# Patient Record
Sex: Female | Born: 1985
Health system: Southern US, Community
[De-identification: ages and names within clinical notes are randomized; demographics above are authoritative.]

## PROBLEM LIST (undated history)

## (undated) ENCOUNTER — Ambulatory Visit

## (undated) DIAGNOSIS — F32A Depression, unspecified: Secondary | ICD-10-CM

## (undated) DIAGNOSIS — E559 Vitamin D deficiency, unspecified: Secondary | ICD-10-CM

## (undated) DIAGNOSIS — F419 Anxiety disorder, unspecified: Secondary | ICD-10-CM

## (undated) DIAGNOSIS — Z789 Other specified health status: Secondary | ICD-10-CM

## (undated) DIAGNOSIS — F988 Other specified behavioral and emotional disorders with onset usually occurring in childhood and adolescence: Secondary | ICD-10-CM

## (undated) HISTORY — DX: Depression, unspecified: F32.A

## (undated) HISTORY — DX: Anxiety disorder, unspecified: F41.9

## (undated) HISTORY — DX: Other specified behavioral and emotional disorders with onset usually occurring in childhood and adolescence: F98.8

---
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
## 2010-02-03 ENCOUNTER — Emergency Department (HOSPITAL_COMMUNITY)
Admission: EM | Admit: 2010-02-03 | Discharge: 2010-02-04 | Payer: Self-pay | Source: Home / Self Care | Admitting: Emergency Medicine

## 2010-07-27 ENCOUNTER — Other Ambulatory Visit: Payer: Self-pay | Admitting: Obstetrics and Gynecology

## 2010-07-27 ENCOUNTER — Other Ambulatory Visit (HOSPITAL_COMMUNITY)
Admission: RE | Admit: 2010-07-27 | Discharge: 2010-07-27 | Disposition: A | Discharge status: Home / Self Care | Payer: BC Managed Care – PPO | Source: Ambulatory Visit | Attending: Obstetrics and Gynecology | Admitting: Obstetrics and Gynecology

## 2010-07-27 DIAGNOSIS — Z1159 Encounter for screening for other viral diseases: Secondary | ICD-10-CM | POA: Insufficient documentation

## 2010-07-27 DIAGNOSIS — Z01419 Encounter for gynecological examination (general) (routine) without abnormal findings: Secondary | ICD-10-CM | POA: Insufficient documentation

## 2010-07-27 DIAGNOSIS — Z113 Encounter for screening for infections with a predominantly sexual mode of transmission: Secondary | ICD-10-CM | POA: Insufficient documentation

## 2010-08-18 LAB — DIFFERENTIAL
Basophils Absolute: 0.1 10*3/uL (ref 0.0–0.1)
Eosinophils Relative: 3 % (ref 0–5)
Lymphocytes Relative: 40 % (ref 12–46)
Lymphs Abs: 4.7 10*3/uL — ABNORMAL HIGH (ref 0.7–4.0)
Monocytes Absolute: 0.6 10*3/uL (ref 0.1–1.0)
Monocytes Relative: 5 % (ref 3–12)
Neutro Abs: 6.1 10*3/uL (ref 1.7–7.7)

## 2010-08-18 LAB — URINALYSIS, ROUTINE W REFLEX MICROSCOPIC
Bilirubin Urine: NEGATIVE
Glucose, UA: NEGATIVE mg/dL
Ketones, ur: NEGATIVE mg/dL
Specific Gravity, Urine: 1.009 (ref 1.005–1.030)
pH: 7.5 (ref 5.0–8.0)

## 2010-08-18 LAB — CBC
HCT: 39.4 % (ref 36.0–46.0)
Hemoglobin: 13.5 g/dL (ref 12.0–15.0)
MCV: 87.9 fL (ref 78.0–100.0)
RDW: 12.8 % (ref 11.5–15.5)
WBC: 11.8 10*3/uL — ABNORMAL HIGH (ref 4.0–10.5)

## 2010-08-18 LAB — BASIC METABOLIC PANEL
BUN: 11 mg/dL (ref 6–23)
Chloride: 102 mEq/L (ref 96–112)
Potassium: 3.9 mEq/L (ref 3.5–5.1)
Sodium: 137 mEq/L (ref 135–145)

## 2010-09-22 ENCOUNTER — Emergency Department (HOSPITAL_COMMUNITY)
Admission: EM | Admit: 2010-09-22 | Discharge: 2010-09-23 | Disposition: A | Discharge status: Home / Self Care | Payer: BC Managed Care – PPO | Attending: Emergency Medicine | Admitting: Emergency Medicine

## 2010-09-22 DIAGNOSIS — R45851 Suicidal ideations: Secondary | ICD-10-CM | POA: Insufficient documentation

## 2010-09-22 DIAGNOSIS — Y929 Unspecified place or not applicable: Secondary | ICD-10-CM | POA: Insufficient documentation

## 2010-09-22 DIAGNOSIS — T43205A Adverse effect of unspecified antidepressants, initial encounter: Secondary | ICD-10-CM | POA: Insufficient documentation

## 2010-09-22 DIAGNOSIS — F319 Bipolar disorder, unspecified: Secondary | ICD-10-CM | POA: Insufficient documentation

## 2010-09-22 DIAGNOSIS — F411 Generalized anxiety disorder: Secondary | ICD-10-CM | POA: Insufficient documentation

## 2010-09-22 DIAGNOSIS — F1994 Other psychoactive substance use, unspecified with psychoactive substance-induced mood disorder: Secondary | ICD-10-CM | POA: Insufficient documentation

## 2010-09-22 DIAGNOSIS — Z79899 Other long term (current) drug therapy: Secondary | ICD-10-CM | POA: Insufficient documentation

## 2010-09-22 LAB — POCT I-STAT, CHEM 8
BUN: 5 mg/dL — ABNORMAL LOW (ref 6–23)
Calcium, Ion: 1.14 mmol/L (ref 1.12–1.32)
Chloride: 101 mEq/L (ref 96–112)
Creatinine, Ser: 0.7 mg/dL (ref 0.4–1.2)
Glucose, Bld: 97 mg/dL (ref 70–99)
Potassium: 3.6 mEq/L (ref 3.5–5.1)

## 2010-09-22 LAB — CBC
HCT: 36.4 % (ref 36.0–46.0)
Hemoglobin: 12.5 g/dL (ref 12.0–15.0)
MCHC: 34.3 g/dL (ref 30.0–36.0)
MCV: 82.9 fL (ref 78.0–100.0)
RDW: 13 % (ref 11.5–15.5)

## 2010-09-22 LAB — DIFFERENTIAL
Basophils Absolute: 0.1 10*3/uL (ref 0.0–0.1)
Eosinophils Relative: 3 % (ref 0–5)
Lymphocytes Relative: 34 % (ref 12–46)
Lymphs Abs: 4.7 10*3/uL — ABNORMAL HIGH (ref 0.7–4.0)
Monocytes Absolute: 0.8 10*3/uL (ref 0.1–1.0)
Neutro Abs: 7.8 10*3/uL — ABNORMAL HIGH (ref 1.7–7.7)

## 2010-09-22 LAB — ETHANOL: Alcohol, Ethyl (B): <5 mg/dL (ref 0–10)

## 2010-09-22 LAB — RAPID URINE DRUG SCREEN, HOSP PERFORMED
Amphetamines: NOT DETECTED
Cocaine: NOT DETECTED
Opiates: NOT DETECTED
Tetrahydrocannabinol: NOT DETECTED

## 2012-03-18 IMAGING — CR DG CHEST 2V
2 series · 2 of 2 positions shown · non-contrast
Comparison: None.

CLINICAL DATA: Shortness of breath with chest discomfort for
several days.

CHEST - 2 VIEW

[w chest pa]
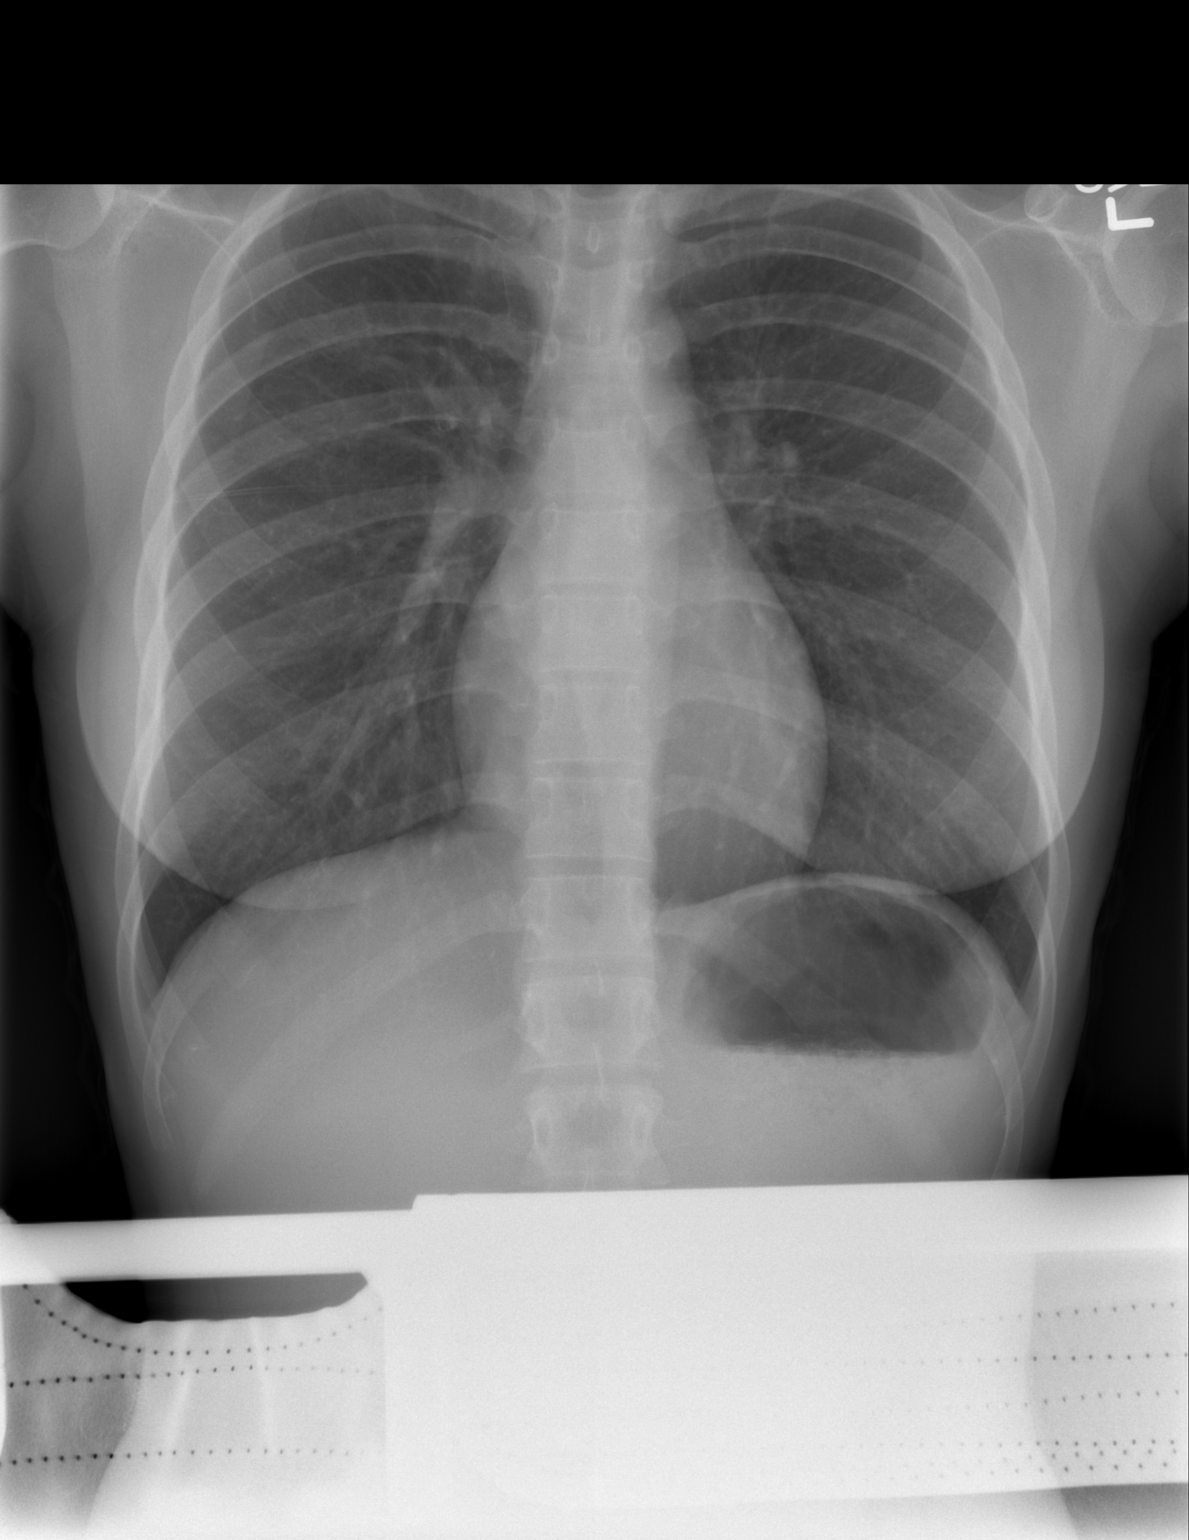

[w chest lat]
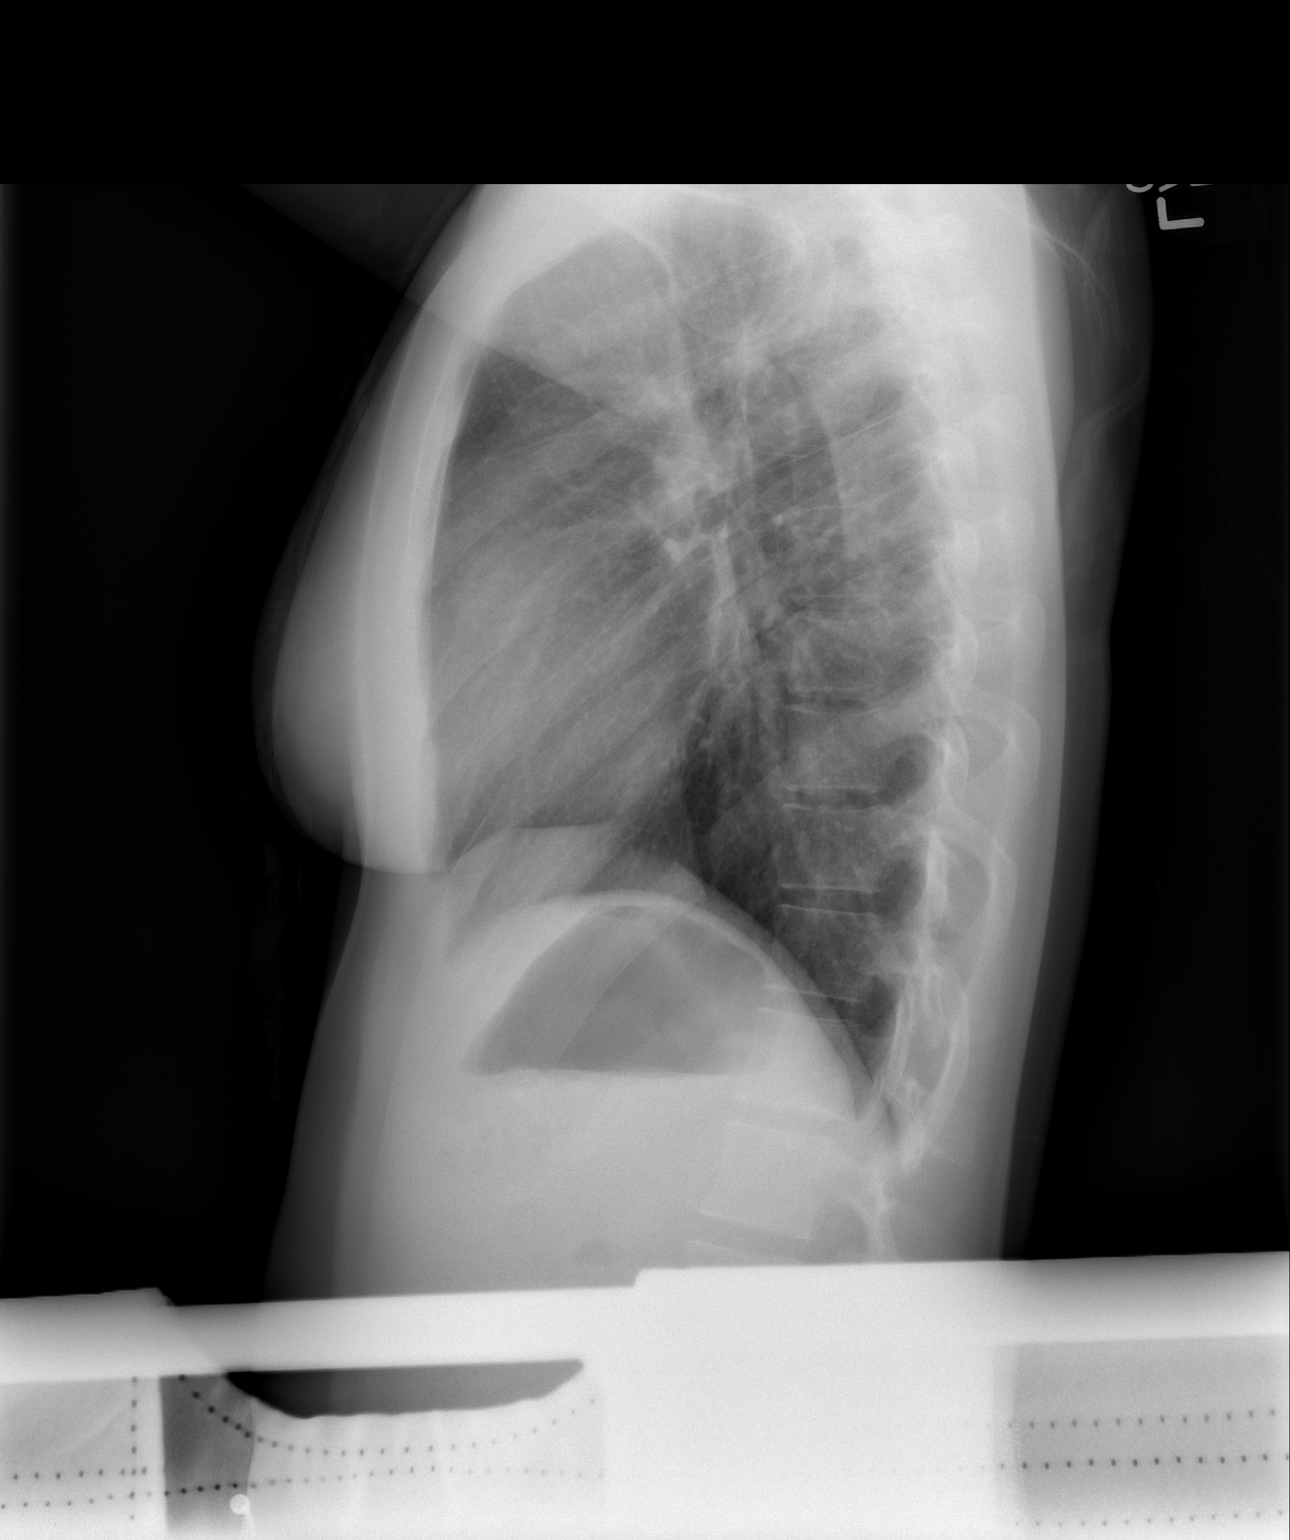

[2 of 2 positions shown; findings below may reference images not displayed]

FINDINGS: The heart size and mediastinal contours are normal. The
lungs are clear. There is no pleural effusion or pneumothorax. No
acute osseous findings are identified.
IMPRESSION: No active cardiopulmonary process.

## 2013-01-29 ENCOUNTER — Encounter (HOSPITAL_COMMUNITY): Payer: Self-pay | Admitting: Anesthesiology

## 2013-01-29 ENCOUNTER — Inpatient Hospital Stay (HOSPITAL_COMMUNITY): Payer: BC Managed Care – PPO | Admitting: Anesthesiology

## 2013-01-29 ENCOUNTER — Encounter (HOSPITAL_COMMUNITY): Payer: Self-pay | Admitting: *Deleted

## 2013-01-29 ENCOUNTER — Inpatient Hospital Stay (HOSPITAL_COMMUNITY)
Admission: AD | Admit: 2013-01-29 | Discharge: 2013-02-01 | DRG: 370 | Disposition: A | Discharge status: Home / Self Care | Payer: BC Managed Care – PPO | Source: Ambulatory Visit | Attending: Obstetrics and Gynecology | Admitting: Obstetrics and Gynecology

## 2013-01-29 DIAGNOSIS — D62 Acute posthemorrhagic anemia: Secondary | ICD-10-CM | POA: Diagnosis not present

## 2013-01-29 DIAGNOSIS — O4290 Premature rupture of membranes, unspecified as to length of time between rupture and onset of labor, unspecified weeks of gestation: Secondary | ICD-10-CM | POA: Diagnosis present

## 2013-01-29 DIAGNOSIS — O9903 Anemia complicating the puerperium: Secondary | ICD-10-CM | POA: Diagnosis not present

## 2013-01-29 HISTORY — DX: Other specified health status: Z78.9

## 2013-01-29 LAB — OB RESULTS CONSOLE RUBELLA ANTIBODY, IGM: Rubella: IMMUNE

## 2013-01-29 LAB — CBC
HCT: 38.2 % (ref 36.0–46.0)
Hemoglobin: 13.1 g/dL (ref 12.0–15.0)
MCH: 29.7 pg (ref 26.0–34.0)
MCHC: 34.3 g/dL (ref 30.0–36.0)
MCV: 86.6 fL (ref 78.0–100.0)
Platelets: 198 10*3/uL (ref 150–400)
RBC: 4.41 MIL/uL (ref 3.87–5.11)
RDW: 13.7 % (ref 11.5–15.5)
WBC: 17.2 10*3/uL — ABNORMAL HIGH (ref 4.0–10.5)

## 2013-01-29 LAB — TYPE AND SCREEN: Antibody Screen: NEGATIVE

## 2013-01-29 LAB — OB RESULTS CONSOLE GC/CHLAMYDIA
Chlamydia: NEGATIVE
Gonorrhea: NEGATIVE

## 2013-01-29 LAB — OB RESULTS CONSOLE ABO/RH

## 2013-01-29 LAB — OB RESULTS CONSOLE HEPATITIS B SURFACE ANTIGEN: Hepatitis B Surface Ag: NEGATIVE

## 2013-01-29 LAB — OB RESULTS CONSOLE ANTIBODY SCREEN: Antibody Screen: NEGATIVE

## 2013-01-29 LAB — RPR: RPR Ser Ql: NONREACTIVE

## 2013-01-29 MED ORDER — DIPHENHYDRAMINE HCL 50 MG/ML IJ SOLN: 12.5000 mg | INTRAMUSCULAR | Status: DC | PRN

## 2013-01-29 MED ORDER — LIDOCAINE HCL (PF) 1 % IJ SOLN
INTRAMUSCULAR | Status: DC | PRN
  Administered 2013-01-29 (×3): 5 mL

## 2013-01-29 MED ORDER — OXYTOCIN 40 UNITS IN LACTATED RINGERS INFUSION - SIMPLE MED: 62.5000 mL/h | INTRAVENOUS | Status: DC

## 2013-01-29 MED ORDER — IBUPROFEN 600 MG PO TABS: 600.0000 mg | ORAL_TABLET | Freq: Four times a day (QID) | ORAL | Status: DC | PRN

## 2013-01-29 MED ORDER — OXYTOCIN 40 UNITS IN LACTATED RINGERS INFUSION - SIMPLE MED
1.0000 m[IU]/min | INTRAVENOUS | Status: DC
  Administered 2013-01-29: 2 m[IU]/min via INTRAVENOUS
  Filled 2013-01-29: qty 1000

## 2013-01-29 MED ORDER — LACTATED RINGERS IV SOLN
500.0000 mL | INTRAVENOUS | Status: DC | PRN
  Administered 2013-01-29: 1000 mL via INTRAVENOUS

## 2013-01-29 MED ORDER — FENTANYL 2.5 MCG/ML BUPIVACAINE 1/10 % EPIDURAL INFUSION (WH - ANES)
INTRAMUSCULAR | Status: DC | PRN
  Administered 2013-01-29: 14 mL/h via EPIDURAL

## 2013-01-29 MED ORDER — EPHEDRINE 5 MG/ML INJ: 10.0000 mg | INTRAVENOUS | Status: DC | PRN

## 2013-01-29 MED ORDER — EPHEDRINE 5 MG/ML INJ
10.0000 mg | INTRAVENOUS | Status: DC | PRN
  Filled 2013-01-29: qty 4

## 2013-01-29 MED ORDER — BUTORPHANOL TARTRATE 1 MG/ML IJ SOLN
1.0000 mg | Freq: Once | INTRAMUSCULAR | Status: AC
  Administered 2013-01-29: 1 mg via INTRAVENOUS
  Filled 2013-01-29: qty 1

## 2013-01-29 MED ORDER — OXYTOCIN BOLUS FROM INFUSION: 500.0000 mL | INTRAVENOUS | Status: DC

## 2013-01-29 MED ORDER — PHENYLEPHRINE 40 MCG/ML (10ML) SYRINGE FOR IV PUSH (FOR BLOOD PRESSURE SUPPORT)
80.0000 ug | PREFILLED_SYRINGE | INTRAVENOUS | Status: DC | PRN
  Filled 2013-01-29: qty 5

## 2013-01-29 MED ORDER — FENTANYL 2.5 MCG/ML BUPIVACAINE 1/10 % EPIDURAL INFUSION (WH - ANES)
14.0000 mL/h | INTRAMUSCULAR | Status: DC | PRN
  Filled 2013-01-29: qty 125

## 2013-01-29 MED ORDER — LACTATED RINGERS IV SOLN
500.0000 mL | Freq: Once | INTRAVENOUS | Status: AC
  Administered 2013-01-29: 500 mL via INTRAVENOUS

## 2013-01-29 MED ORDER — LIDOCAINE HCL (PF) 1 % IJ SOLN: 30.0000 mL | INTRAMUSCULAR | Status: DC | PRN

## 2013-01-29 MED ORDER — PHENYLEPHRINE 40 MCG/ML (10ML) SYRINGE FOR IV PUSH (FOR BLOOD PRESSURE SUPPORT): 80.0000 ug | PREFILLED_SYRINGE | INTRAVENOUS | Status: DC | PRN

## 2013-01-29 MED ORDER — ACETAMINOPHEN 325 MG PO TABS: 650.0000 mg | ORAL_TABLET | ORAL | Status: DC | PRN

## 2013-01-29 MED ORDER — CITRIC ACID-SODIUM CITRATE 334-500 MG/5ML PO SOLN
30.0000 mL | ORAL | Status: DC | PRN
  Administered 2013-01-30: 30 mL via ORAL
  Filled 2013-01-29: qty 15

## 2013-01-29 NOTE — Progress Notes (Signed)
S:  Painful ctx - questioning about pain medicine versus epidural  O:  VS: Blood pressure 117/81, pulse 73, temperature 98.1 F (36.7 C), temperature source Oral, resp. rate 20, height 5' (1.524 m), weight 70.761 kg (156 lb).        FHR : baseline 145 / variability moderate / accelerations + / early decelerations        Toco: contractions every 2-3 minutes / moderate / pitocin 35mu/min        Cervix : 5cm / 90% / vtx / -1        Membranes: clear  A: active labor     FHR category 1  P: discussed pain medication versus epidural anesthesia      desires to try IV medication initially   Marlinda Mike CNM, MSN, [facility] 01/29/2013, 8:03 PM

## 2013-01-29 NOTE — Progress Notes (Signed)
Beacon applied 

## 2013-01-29 NOTE — Anesthesia Procedure Notes (Signed)
Epidural Patient location during procedure: OB  Staffing Anesthesiologist: Jep Dyas Performed by: anesthesiologist   Preanesthetic Checklist Completed: patient identified, site marked, surgical consent, pre-op evaluation, timeout performed, IV checked, risks and benefits discussed and monitors and equipment checked  Epidural Patient position: sitting Prep: ChloraPrep Patient monitoring: heart rate, continuous pulse ox and blood pressure Approach: midline Injection technique: LOR saline  Needle:  Needle type: Tuohy  Needle gauge: 17 G Needle length: 9 cm and 9 Needle insertion depth: 6 cm Catheter type: closed end flexible Catheter size: 20 Guage Catheter at skin depth: 12 cm Test dose: negative  Assessment Events: blood not aspirated, injection not painful, no injection resistance, negative IV test and no paresthesia  Additional Notes   Patient tolerated the insertion well without complications.   

## 2013-01-29 NOTE — Progress Notes (Signed)
S:  painful ctx - moaning and crying with ctx       did not like how IV medication made her feel - does not want any more IV medication       really does not want epidural         O:  VS: Blood pressure 124/88, pulse 88, temperature 98.1 F (36.7 C), temperature source Oral, resp. rate 20, height 5' (1.524 m), weight 70.761 kg (156 lb).        FHR : baseline 140 / variability moderate / accelerations + / no decelerations        Toco: contractions every 3-5 minutes / moderate / pitocin 4 mu/min        Cervix : 6cm / 90% / vtx / -1 / LOT        Membranes: clear fluid with pink show  A: active labor     FHR category 1  P: expectant management      labor support with midwife and student midwife at bedside  Marlinda Mike CNM, MSN, [facility] 01/29/2013, 9:21 PM

## 2013-01-29 NOTE — H&P (Signed)
OB ADMISSION/ HISTORY & PHYSICAL:  Admission Date: 01/29/2013 10:41 AM  Admit Diagnosis: 40.2 weeks  / normal labor onset SROM  Kristin Gonzalez is a 27 y.o. female presenting for onset of labor after SROM at 0800 / seen in office SROM confirmed  By Dr Juliene Pina - cervix 2cm dilated / discussed pitocin augmentation. Desires no intervention if possible - wants to try natural childbirth. Requests local for IV start.  Prenatal History: G3P0020   EDC : 01/27/2013, by Other Basis  Prenatal care at [facility]  Primary Ob Provider: Dr Juliene Pina Prenatal course complicated by tobacco use / low AFI  Prenatal Labs: ABO, Rh: O/Positive/-- (08/27 1100) Antibody: Negative (08/27 1100) Rubella: Immune (08/27 1100)  RPR: Nonreactive (08/27 1100)  HBsAg: Negative (08/27 1100)  HIV: Non-reactive (08/27 1100)  GTT: NL GBS: Negative (08/27 1100)   Medical / Surgical History :  Past medical history:  Past Medical History  Diagnosis Date  . Medical history non-contributory      Past surgical history: History reviewed. No pertinent past surgical history.  Family History: History reviewed. No pertinent family history.   Social History:  reports that she has quit smoking. Her smoking use included Cigarettes. She smoked 0.00 packs per day. She does not have any smokeless tobacco history on file. She reports that she does not drink alcohol or use illicit drugs.  Allergies: Review of patient's allergies indicates no known allergies.   Current Medications at time of admission:  Prior to Admission medications   Not on File    Review of Systems: Active FM onset of ctx @ 0800 currently every 5 minutes LOF  / SROM @ 0800 bloody show none  Physical Exam:  VS: Height 5' (1.524 m), weight 156 lb (70.761 kg).  General: alert and oriented, appears comfortable without pain Heart: RRR Lungs: Clear lung fields Abdomen: Gravid, soft and non-tender, non-distended / uterus: gravid Extremities: no  edema  Genitalia / VE:  deferred  FHR: baseline rate 135 / variability moderate / accelerations + / no decelerations TOCO: mild irregular ctx  Assessment: 40.[redacted]  weeks gestation latent stage of labor - possible PROM FHR category 1   Plan:  Admit Expectant management - re-evaluate at 1400 for pitocin augmentation Dr Cherly Hensen notified of admission - CNM to follow after 1pm for labor / birth due to patient request for female provider   Marlinda Mike CNM, MSN, [facility] 01/29/2013, 11:28 AM

## 2013-01-29 NOTE — Progress Notes (Signed)
S:  crying with ctx - "cant take it anymore"      wants epidural  O:  VS: Blood pressure 132/70, pulse 72, temperature 99.3 F (37.4 C), temperature source Oral, resp. rate 20, height 5' (1.524 m), weight 70.761 kg (156 lb).        FHR : baseline 140 / variability moderate / accelerations + / occasional mild variable decelerations        Toco: contractions every 2-3 minutes / moderate -strong / Pitocin 27mu/min        Cervix : same        Membranes: pink discharge  A: active labor     FHR category 1  P: epidural placement   Marlinda Mike CNM, MSN, [facility] 01/29/2013, 10:24 PM

## 2013-01-29 NOTE — Anesthesia Preprocedure Evaluation (Signed)

## 2013-01-29 NOTE — Progress Notes (Signed)
S:  Contractions more uncomfortable - breathing with ctx now       Would like to continue to ambulate at this time  O:  VS: Blood pressure 125/68, pulse 71, temperature 98.1 F (36.7 C), temperature source Oral, resp. rate 18, height 5' (1.524 m), weight 70.761 kg (156 lb).        FHR : baseline 135 / variability moderate / accelerations + / no decelerations        Toco: contractions every 2-4 minutes / 30-60 / Mild        Cervix : 3.5 / 80% / vtx -1        Membranes: clear fluid  A: latent labor     FHR category 1  P: Discussed pitocin augmentation versus continued expectant management      Patient desires expectant management at this time        Recheck in 1-3 hours   Marlinda Mike CNM, MSN, [facility] 01/29/2013, 2:30 PM

## 2013-01-29 NOTE — Progress Notes (Signed)
S:  No change in ctx - not more painful  O:  VS: Blood pressure 121/75, pulse 70, temperature 97.7 F (36.5 C), temperature source Oral, resp. rate 20, height 5' (1.524 m), weight 70.761 kg (156 lb).        FHR : baseline 135 / variability moderate / accelerations + / no decelerations        Toco: contractions every 3-5 minutes / mild         Cervix : 3.5 / 80 % / vtx -1        Membranes: clear  IV start with 1 % xylocaine local - # 18 gauge to right hand after initial site in right forearm with edema at site  A: PROM - latent labor     FHR category 1  P: pitocin augmentation     Midwifery labor support    Marlinda Mike CNM, MSN, [facility] 01/29/2013, 5:14 PM

## 2013-01-30 ENCOUNTER — Encounter (HOSPITAL_COMMUNITY): Payer: Self-pay | Admitting: Anesthesiology

## 2013-01-30 ENCOUNTER — Encounter (HOSPITAL_COMMUNITY)
Admission: AD | Disposition: A | Discharge status: Home / Self Care | Payer: Self-pay | Source: Ambulatory Visit | Attending: Obstetrics and Gynecology

## 2013-01-30 SURGERY — Surgical Case
Anesthesia: Regional

## 2013-01-30 SURGERY — Surgical Case
Anesthesia: Epidural | Site: Abdomen | Wound class: Clean Contaminated

## 2013-01-30 MED ORDER — TERBUTALINE SULFATE 1 MG/ML IJ SOLN
0.2500 mg | Freq: Once | INTRAMUSCULAR | Status: AC
  Administered 2013-01-30: 0.25 mg via SUBCUTANEOUS

## 2013-01-30 MED ORDER — SENNOSIDES-DOCUSATE SODIUM 8.6-50 MG PO TABS
2.0000 | ORAL_TABLET | Freq: Every day | ORAL | Status: DC
  Administered 2013-01-30 – 2013-01-31 (×2): 2 via ORAL

## 2013-01-30 MED ORDER — FENTANYL CITRATE 0.05 MG/ML IJ SOLN: 25.0000 ug | INTRAMUSCULAR | Status: DC | PRN

## 2013-01-30 MED ORDER — ONDANSETRON HCL 4 MG/2ML IJ SOLN: 4.0000 mg | INTRAMUSCULAR | Status: DC | PRN

## 2013-01-30 MED ORDER — ONDANSETRON HCL 4 MG/2ML IJ SOLN
INTRAMUSCULAR | Status: AC
  Filled 2013-01-30: qty 2

## 2013-01-30 MED ORDER — DIPHENHYDRAMINE HCL 50 MG/ML IJ SOLN: 25.0000 mg | INTRAMUSCULAR | Status: DC | PRN

## 2013-01-30 MED ORDER — LACTATED RINGERS IV SOLN
INTRAVENOUS | Status: DC | PRN
  Administered 2013-01-30 (×3): via INTRAVENOUS

## 2013-01-30 MED ORDER — LACTATED RINGERS IV SOLN
INTRAVENOUS | Status: DC
  Administered 2013-01-30: 11:00:00 via INTRAVENOUS

## 2013-01-30 MED ORDER — SODIUM CHLORIDE 0.9 % IJ SOLN: 3.0000 mL | INTRAMUSCULAR | Status: DC | PRN

## 2013-01-30 MED ORDER — LACTATED RINGERS IV SOLN: INTRAVENOUS | Status: DC

## 2013-01-30 MED ORDER — ONDANSETRON HCL 4 MG/2ML IJ SOLN: 4.0000 mg | Freq: Three times a day (TID) | INTRAMUSCULAR | Status: DC | PRN

## 2013-01-30 MED ORDER — ZOLPIDEM TARTRATE 5 MG PO TABS: 5.0000 mg | ORAL_TABLET | Freq: Every evening | ORAL | Status: DC | PRN

## 2013-01-30 MED ORDER — OXYCODONE-ACETAMINOPHEN 5-325 MG PO TABS
1.0000 | ORAL_TABLET | ORAL | Status: DC | PRN
  Administered 2013-01-31 – 2013-02-01 (×3): 1 via ORAL
  Filled 2013-01-30 (×3): qty 1

## 2013-01-30 MED ORDER — NALBUPHINE HCL 10 MG/ML IJ SOLN
5.0000 mg | INTRAMUSCULAR | Status: DC | PRN
  Filled 2013-01-30: qty 1

## 2013-01-30 MED ORDER — MORPHINE SULFATE 0.5 MG/ML IJ SOLN
INTRAMUSCULAR | Status: AC
  Filled 2013-01-30: qty 10

## 2013-01-30 MED ORDER — NALOXONE HCL 1 MG/ML IJ SOLN
1.0000 ug/kg/h | INTRAMUSCULAR | Status: DC | PRN
  Filled 2013-01-30: qty 2

## 2013-01-30 MED ORDER — CEFAZOLIN SODIUM-DEXTROSE 2-3 GM-% IV SOLR
INTRAVENOUS | Status: DC | PRN
  Administered 2013-01-30: 2 g via INTRAVENOUS

## 2013-01-30 MED ORDER — SCOPOLAMINE 1 MG/3DAYS TD PT72
1.0000 | MEDICATED_PATCH | Freq: Once | TRANSDERMAL | Status: DC
  Administered 2013-01-30: 1.5 mg via TRANSDERMAL

## 2013-01-30 MED ORDER — KETOROLAC TROMETHAMINE 60 MG/2ML IM SOLN
INTRAMUSCULAR | Status: AC
  Filled 2013-01-30: qty 2

## 2013-01-30 MED ORDER — LIDOCAINE-EPINEPHRINE (PF) 2 %-1:200000 IJ SOLN
INTRAMUSCULAR | Status: AC
  Filled 2013-01-30: qty 20

## 2013-01-30 MED ORDER — TETANUS-DIPHTH-ACELL PERTUSSIS 5-2.5-18.5 LF-MCG/0.5 IM SUSP
0.5000 mL | Freq: Once | INTRAMUSCULAR | Status: DC
  Filled 2013-01-30: qty 0.5

## 2013-01-30 MED ORDER — MORPHINE SULFATE (PF) 0.5 MG/ML IJ SOLN
INTRAMUSCULAR | Status: DC | PRN
  Administered 2013-01-30: 4 mg via EPIDURAL

## 2013-01-30 MED ORDER — METOCLOPRAMIDE HCL 5 MG/ML IJ SOLN
INTRAMUSCULAR | Status: AC
  Filled 2013-01-30: qty 2

## 2013-01-30 MED ORDER — DIPHENHYDRAMINE HCL 25 MG PO CAPS: 25.0000 mg | ORAL_CAPSULE | ORAL | Status: DC | PRN

## 2013-01-30 MED ORDER — ONDANSETRON HCL 4 MG/2ML IJ SOLN
INTRAMUSCULAR | Status: DC | PRN
  Administered 2013-01-30: 4 mg via INTRAVENOUS

## 2013-01-30 MED ORDER — SIMETHICONE 80 MG PO CHEW
80.0000 mg | CHEWABLE_TABLET | Freq: Three times a day (TID) | ORAL | Status: DC
  Administered 2013-01-30 – 2013-02-01 (×6): 80 mg via ORAL

## 2013-01-30 MED ORDER — KETOROLAC TROMETHAMINE 30 MG/ML IJ SOLN: 30.0000 mg | Freq: Four times a day (QID) | INTRAMUSCULAR | Status: AC | PRN

## 2013-01-30 MED ORDER — FENTANYL CITRATE 0.05 MG/ML IJ SOLN
INTRAMUSCULAR | Status: AC
  Filled 2013-01-30: qty 2

## 2013-01-30 MED ORDER — FENTANYL CITRATE 0.05 MG/ML IJ SOLN
INTRAMUSCULAR | Status: DC | PRN
  Administered 2013-01-30 (×2): 50 ug via INTRAVENOUS

## 2013-01-30 MED ORDER — MORPHINE SULFATE (PF) 0.5 MG/ML IJ SOLN
INTRAMUSCULAR | Status: DC | PRN
  Administered 2013-01-30: 1 mg via INTRAVENOUS

## 2013-01-30 MED ORDER — CEFAZOLIN SODIUM-DEXTROSE 2-3 GM-% IV SOLR
INTRAVENOUS | Status: AC
  Filled 2013-01-30: qty 50

## 2013-01-30 MED ORDER — NALOXONE HCL 0.4 MG/ML IJ SOLN: 0.4000 mg | INTRAMUSCULAR | Status: DC | PRN

## 2013-01-30 MED ORDER — MEPERIDINE HCL 25 MG/ML IJ SOLN: 6.2500 mg | INTRAMUSCULAR | Status: DC | PRN

## 2013-01-30 MED ORDER — METOCLOPRAMIDE HCL 5 MG/ML IJ SOLN
10.0000 mg | Freq: Three times a day (TID) | INTRAMUSCULAR | Status: DC | PRN
  Administered 2013-01-30: 10 mg via INTRAVENOUS

## 2013-01-30 MED ORDER — OXYTOCIN 40 UNITS IN LACTATED RINGERS INFUSION - SIMPLE MED: 62.5000 mL/h | INTRAVENOUS | Status: AC

## 2013-01-30 MED ORDER — LANOLIN HYDROUS EX OINT: 1.0000 "application " | TOPICAL_OINTMENT | CUTANEOUS | Status: DC | PRN

## 2013-01-30 MED ORDER — ONDANSETRON HCL 4 MG PO TABS: 4.0000 mg | ORAL_TABLET | ORAL | Status: DC | PRN

## 2013-01-30 MED ORDER — SCOPOLAMINE 1 MG/3DAYS TD PT72
MEDICATED_PATCH | TRANSDERMAL | Status: AC
  Filled 2013-01-30: qty 1

## 2013-01-30 MED ORDER — ACETAMINOPHEN 500 MG PO TABS
1000.0000 mg | ORAL_TABLET | Freq: Four times a day (QID) | ORAL | Status: DC | PRN
  Administered 2013-01-30: 1000 mg via ORAL
  Filled 2013-01-30: qty 2

## 2013-01-30 MED ORDER — MENTHOL 3 MG MT LOZG
1.0000 | LOZENGE | OROMUCOSAL | Status: DC | PRN
  Filled 2013-01-30: qty 9

## 2013-01-30 MED ORDER — PRENATAL MULTIVITAMIN CH
1.0000 | ORAL_TABLET | Freq: Every day | ORAL | Status: DC
  Filled 2013-01-30 (×4): qty 1

## 2013-01-30 MED ORDER — OXYTOCIN 10 UNIT/ML IJ SOLN
40.0000 [IU] | INTRAVENOUS | Status: DC | PRN
  Administered 2013-01-30: 40 [IU] via INTRAVENOUS

## 2013-01-30 MED ORDER — CEFAZOLIN SODIUM-DEXTROSE 2-3 GM-% IV SOLR
2.0000 g | INTRAVENOUS | Status: AC
  Filled 2013-01-30: qty 50

## 2013-01-30 MED ORDER — WITCH HAZEL-GLYCERIN EX PADS: 1.0000 "application " | MEDICATED_PAD | CUTANEOUS | Status: DC | PRN

## 2013-01-30 MED ORDER — DIPHENHYDRAMINE HCL 50 MG/ML IJ SOLN
12.5000 mg | INTRAMUSCULAR | Status: DC | PRN
  Administered 2013-01-30: 12.5 mg via INTRAVENOUS
  Filled 2013-01-30: qty 1

## 2013-01-30 MED ORDER — SODIUM BICARBONATE 8.4 % IV SOLN
INTRAVENOUS | Status: AC
  Filled 2013-01-30: qty 50

## 2013-01-30 MED ORDER — OXYTOCIN 10 UNIT/ML IJ SOLN
INTRAMUSCULAR | Status: AC
  Filled 2013-01-30: qty 4

## 2013-01-30 MED ORDER — TERBUTALINE SULFATE 1 MG/ML IJ SOLN
INTRAMUSCULAR | Status: AC
  Filled 2013-01-30: qty 1

## 2013-01-30 MED ORDER — DIPHENHYDRAMINE HCL 25 MG PO CAPS
25.0000 mg | ORAL_CAPSULE | Freq: Four times a day (QID) | ORAL | Status: DC | PRN
  Filled 2013-01-30: qty 1

## 2013-01-30 MED ORDER — SIMETHICONE 80 MG PO CHEW: 80.0000 mg | CHEWABLE_TABLET | ORAL | Status: DC | PRN

## 2013-01-30 MED ORDER — DIBUCAINE 1 % RE OINT
1.0000 "application " | TOPICAL_OINTMENT | RECTAL | Status: DC | PRN
  Filled 2013-01-30: qty 28

## 2013-01-30 MED ORDER — KETOROLAC TROMETHAMINE 60 MG/2ML IM SOLN
60.0000 mg | Freq: Once | INTRAMUSCULAR | Status: AC | PRN
  Administered 2013-01-30: 60 mg via INTRAMUSCULAR

## 2013-01-30 MED ORDER — IBUPROFEN 600 MG PO TABS
600.0000 mg | ORAL_TABLET | Freq: Four times a day (QID) | ORAL | Status: DC
  Administered 2013-01-30 – 2013-02-01 (×9): 600 mg via ORAL
  Filled 2013-01-30 (×7): qty 1

## 2013-01-30 MED ORDER — SODIUM BICARBONATE 8.4 % IV SOLN
INTRAVENOUS | Status: DC | PRN
  Administered 2013-01-30: 10 mL via EPIDURAL

## 2013-01-30 SURGICAL SUPPLY — 38 items
APL SKNCLS STERI-STRIP NONHPOA (GAUZE/BANDAGES/DRESSINGS) ×1
BENZOIN TINCTURE PRP APPL 2/3 (GAUZE/BANDAGES/DRESSINGS) ×1 IMPLANT
CLAMP CORD UMBIL (MISCELLANEOUS) IMPLANT
CLOTH BEACON ORANGE TIMEOUT ST (SAFETY) ×2 IMPLANT
CONTAINER PREFILL 10% NBF 15ML (MISCELLANEOUS) IMPLANT
DRAPE LG THREE QUARTER DISP (DRAPES) ×2 IMPLANT
DRSG OPSITE POSTOP 4X10 (GAUZE/BANDAGES/DRESSINGS) ×2 IMPLANT
DURAPREP 26ML APPLICATOR (WOUND CARE) ×2 IMPLANT
ELECT REM PT RETURN 9FT ADLT (ELECTROSURGICAL) ×2
ELECTRODE REM PT RTRN 9FT ADLT (ELECTROSURGICAL) ×1 IMPLANT
EXTRACTOR VACUUM KIWI (MISCELLANEOUS) IMPLANT
EXTRACTOR VACUUM M CUP 4 TUBE (SUCTIONS) ×1 IMPLANT
GLOVE BIO SURGEON STRL SZ7 (GLOVE) ×2 IMPLANT
GLOVE BIOGEL PI IND STRL 7.0 (GLOVE) ×1 IMPLANT
GLOVE BIOGEL PI INDICATOR 7.0 (GLOVE) ×1
GOWN STRL REIN XL XLG (GOWN DISPOSABLE) ×4 IMPLANT
KIT ABG SYR 3ML LUER SLIP (SYRINGE) IMPLANT
NDL HYPO 25X5/8 SAFETYGLIDE (NEEDLE) IMPLANT
NEEDLE HYPO 25X5/8 SAFETYGLIDE (NEEDLE) IMPLANT
NS IRRIG 1000ML POUR BTL (IV SOLUTION) ×2 IMPLANT
PACK C SECTION WH (CUSTOM PROCEDURE TRAY) ×2 IMPLANT
PAD OB MATERNITY 4.3X12.25 (PERSONAL CARE ITEMS) ×2 IMPLANT
RTRCTR C-SECT PINK 25CM LRG (MISCELLANEOUS) IMPLANT
STAPLER VISISTAT 35W (STAPLE) IMPLANT
STRIP CLOSURE SKIN 1/4X4 (GAUZE/BANDAGES/DRESSINGS) ×1 IMPLANT
SUT MON AB-0 CT1 36 (SUTURE) ×6 IMPLANT
SUT PLAIN 0 NONE (SUTURE) IMPLANT
SUT PLAIN 2 0 (SUTURE)
SUT PLAIN ABS 2-0 CT1 27XMFL (SUTURE) IMPLANT
SUT VIC AB 0 CT1 27 (SUTURE) ×4
SUT VIC AB 0 CT1 27XBRD ANBCTR (SUTURE) ×2 IMPLANT
SUT VIC AB 2-0 CT1 27 (SUTURE) ×4
SUT VIC AB 2-0 CT1 TAPERPNT 27 (SUTURE) ×2 IMPLANT
SUT VIC AB 4-0 KS 27 (SUTURE) IMPLANT
SUT VICRYL 0 TIES 12 18 (SUTURE) IMPLANT
TOWEL OR 17X24 6PK STRL BLUE (TOWEL DISPOSABLE) ×2 IMPLANT
TRAY FOLEY CATH 14FR (SET/KITS/TRAYS/PACK) IMPLANT
WATER STERILE IRR 1000ML POUR (IV SOLUTION) ×2 IMPLANT

## 2013-01-30 SURGICAL SUPPLY — 38 items
APL SKNCLS STERI-STRIP NONHPOA (GAUZE/BANDAGES/DRESSINGS)
BENZOIN TINCTURE PRP APPL 2/3 (GAUZE/BANDAGES/DRESSINGS) IMPLANT
CLAMP CORD UMBIL (MISCELLANEOUS) IMPLANT
CLOTH BEACON ORANGE TIMEOUT ST (SAFETY) ×1 IMPLANT
CONTAINER PREFILL 10% NBF 15ML (MISCELLANEOUS) IMPLANT
DRAPE LG THREE QUARTER DISP (DRAPES) ×1 IMPLANT
DRSG OPSITE POSTOP 4X10 (GAUZE/BANDAGES/DRESSINGS) ×1 IMPLANT
DURAPREP 26ML APPLICATOR (WOUND CARE) ×1 IMPLANT
ELECT REM PT RETURN 9FT ADLT (ELECTROSURGICAL)
ELECTRODE REM PT RTRN 9FT ADLT (ELECTROSURGICAL) ×1 IMPLANT
EXTRACTOR VACUUM KIWI (MISCELLANEOUS) IMPLANT
EXTRACTOR VACUUM M CUP 4 TUBE (SUCTIONS) IMPLANT
GLOVE BIO SURGEON STRL SZ7 (GLOVE) ×1 IMPLANT
GLOVE BIOGEL PI IND STRL 7.0 (GLOVE) ×1 IMPLANT
GLOVE BIOGEL PI INDICATOR 7.0 (GLOVE)
GOWN STRL REIN XL XLG (GOWN DISPOSABLE) ×2 IMPLANT
KIT ABG SYR 3ML LUER SLIP (SYRINGE) IMPLANT
NDL HYPO 25X5/8 SAFETYGLIDE (NEEDLE) IMPLANT
NEEDLE HYPO 25X5/8 SAFETYGLIDE (NEEDLE) IMPLANT
NS IRRIG 1000ML POUR BTL (IV SOLUTION) ×1 IMPLANT
PACK C SECTION WH (CUSTOM PROCEDURE TRAY) ×1 IMPLANT
PAD OB MATERNITY 4.3X12.25 (PERSONAL CARE ITEMS) ×1 IMPLANT
RTRCTR C-SECT PINK 25CM LRG (MISCELLANEOUS) IMPLANT
STAPLER VISISTAT 35W (STAPLE) IMPLANT
STRIP CLOSURE SKIN 1/4X4 (GAUZE/BANDAGES/DRESSINGS) IMPLANT
SUT MON AB-0 CT1 36 (SUTURE) ×3 IMPLANT
SUT PLAIN 0 NONE (SUTURE) IMPLANT
SUT PLAIN 2 0 (SUTURE)
SUT PLAIN ABS 2-0 CT1 27XMFL (SUTURE) IMPLANT
SUT VIC AB 0 CT1 27 (SUTURE)
SUT VIC AB 0 CT1 27XBRD ANBCTR (SUTURE) ×2 IMPLANT
SUT VIC AB 2-0 CT1 27 (SUTURE)
SUT VIC AB 2-0 CT1 TAPERPNT 27 (SUTURE) ×2 IMPLANT
SUT VIC AB 4-0 KS 27 (SUTURE) IMPLANT
SUT VICRYL 0 TIES 12 18 (SUTURE) IMPLANT
TOWEL OR 17X24 6PK STRL BLUE (TOWEL DISPOSABLE) ×1 IMPLANT
TRAY FOLEY CATH 14FR (SET/KITS/TRAYS/PACK) IMPLANT
WATER STERILE IRR 1000ML POUR (IV SOLUTION) ×1 IMPLANT

## 2013-01-30 NOTE — Progress Notes (Signed)
Patient ID: Anneth Brunell, female   DOB: 1985/10/08, 27 y.o.   MRN: [id number] INTERVAL NOTE:  S:   Sitting in bed, feeling very sleepy, min cramping, (+) voids, small bleed, denies HA/NV/dizziness  Reports itching - no relief after Benadryl given ~25 mins ago   Planning circ with [facility] within 2 weeks  O:   VSS, AAO x 3, NAD  Fundus @ U  Scant lochia  A / P:   PPD #0  Stable post partum  Routine PP orders  Advised to allow more time for Benadryl to start to affect itching   Kenard Gower, MSN, CNM 01/30/2013, 10:16 AM

## 2013-01-30 NOTE — H&P (Signed)
Reviewed and agree with note and plan. V.Rayetta Veith, MD  

## 2013-01-30 NOTE — Transfer of Care (Signed)
Immediate Anesthesia Transfer of Care Note  Patient: Kristin Gonzalez  Procedure(s) Performed: Procedure(s): CESAREAN SECTION (N/A)  Patient Location: PACU  Anesthesia Type:Epidural  Level of Consciousness: awake  Airway & Oxygen Therapy: Patient Spontanous Breathing  Post-op Assessment: Report given to PACU RN and Post -op Vital signs reviewed and stable  Post vital signs: stable  Complications: No apparent anesthesia complications

## 2013-01-30 NOTE — Progress Notes (Signed)
S:  Comfortable - no pressure  O:  VS: Blood pressure 119/65, pulse 96, temperature 99.9 F (37.7 C), temperature source Oral, resp. rate 20, height 5' (1.524 m), weight 70.761 kg (156 lb).        FHR : baseline 180 / variability moderate / accelerations + / variable decelerations        Toco: contractions every 2-3 minutes / pitocin 6 mu/min         Cervix : 9cm - thickened anterior rim / no descent        Membranes: clear fluid  A: arrest of labor     FHR category 2-3  P: consult with Dr Juliene Pina for Cesarean section delivery - orders received - enroute    Marlinda Mike CNM, MSN, [facility] 01/30/2013, 2:51 AM

## 2013-01-30 NOTE — Progress Notes (Signed)
S:  Pressure with ctx  O:  VS: Blood pressure 121/52, pulse 85, temperature 99.9 F (37.7 C), temperature source Oral, resp. rate 20, height 5' (1.524 m), weight 70.761 kg (156 lb).        FHR : baseline 160 / variability moderate / accelerations + / variables decelerations        Toco: contractions every 3 minutes / pitocin  6 mu/min        Cervix : 9cm / 95% / vtx / +2        Membranes: clear fluid with show  A: active labor     FHR category 2  P: maternal position changes     IVF bolus 500 ml / tylenol 1000mg      Dr Juliene Pina updated with status         Kristin Gonzalez CNM, MSN, [facility] 01/30/2013, 1:20 AM

## 2013-01-30 NOTE — Progress Notes (Signed)
Dr mody at bedside discussing with pt risks and benefits of primary c section, pt verbalizes and understands to proceed with primary c section.

## 2013-01-30 NOTE — Anesthesia Postprocedure Evaluation (Signed)
  Anesthesia Post-op Note  Patient: Kristin Gonzalez  Procedure(s) Performed: Procedure(s): CESAREAN SECTION (N/A)  Patient Location: Mother/Baby  Anesthesia Type:Epidural  Level of Consciousness: awake, alert  and oriented  Airway and Oxygen Therapy: Patient Spontanous Breathing  Post-op Pain: mild  Post-op Assessment: Post-op Vital signs reviewed, Patient's Cardiovascular Status Stable, No headache, No backache, No residual numbness and No residual motor weakness  Post-op Vital Signs: Reviewed and stable  Complications: No apparent anesthesia complications

## 2013-01-30 NOTE — Op Note (Signed)
Cesarean Section Procedure Note Kristin Gonzalez 01/31/2013  Procedure: Primary Low Transverse Cesarean Section  Indications: Term, active labor with Arrest of dilatation at 9 cm, fetal tachycardia  Pre-operative Diagnosis: arrest of labor / FHR decels / FHR tachycardia.   Post-operative Diagnosis: Same   Surgeon: Robley Fries, MD    Assistants: Marlinda Mike, CNM   Anesthesia: epidural   Procedure Details:  The patient was seen in the Labor Room and evaluated. Fetal tachycardia, arrest of dilatation at 9 cm and ROT position discussed with recommendation to have Cesarean delivery. The risks, benefits, complications, treatment options, and expected outcomes were discussed with the patient. The patient concurred with the proposed plan, giving informed consent. identified as Museum/gallery curator and the procedure verified as C-Section Delivery.  She was brought to the Operating Room and a Time Out was held and the above information confirmed. 2 gm Ancef given. After induction of anesthesia, the patient was draped and prepped in the usual sterile manner. A Pfannenstiel Incision was made and carried down through the subcutaneous tissue to the fascia. Fascial incision was made and extended transversely. The fascia was separated from the underlying rectus tissue superiorly and inferiorly. The peritoneum was identified and entered. Peritoneal incision was extended longitudinally. Alexis-O retractor was placed carefully. The utero-vesical peritoneal reflection was incised transversely and the bladder flap was bluntly freed from the lower uterine segment. A low transverse uterine incision was made. Amniotomy revealed slight meconium tinge fluid. Delivered from ROT cephalic presentation was a healthy FEMALE infant at 3.40 am on 01/30/2013, 2 nuchal cord loops released over the head before completing the delivery. Apgar scores of 9 at one minute and 9 at five minutes. Cord ph was sent the umbilical cord was clamped and cut  cord blood was obtained for evaluation, baby handed to NICU team in attendance. The placenta was removed Intact and appeared normal. The uterine outline, tubes and ovaries appeared normal. The uterine incision was closed with running locked sutures of 0-Monocryl followed by another layer imbricating first.  Hemostasis was observed. Peritoneum closure done with 2-0 Vicryl.The fascia was then reapproximated with running sutures of 0Vicryl. The subcuticular closure was performed using 4-0 Vicryl. Steri-strips and sterile dressing placed.    Instrument, sponge, and needle counts were correct prior the abdominal closure and were correct at the conclusion of the case.   Findings: Female infant delivered from ROT position at 3.40 am on 01/30/13 with Apgars 9 and 9 at 1 and 5 minutes. 2 loose nuchal cord noted. Apgars 9 and 9 at , 5 min. Normal tubes and ovaries and uterus. Placenta normal, 3 vessel cord.    Estimated Blood Loss: 600 cc   Total IV Fluids:  2000 ml  LR  Urine Output: 100CC OF clear urine  Specimens: Cord blood and cord gas  Complications: no complications  Disposition: PACU - hemodynamically stable.   Maternal Condition: stable   Baby condition / location:  nursery-stable  Attending Attestation: I performed the procedure.   Signed: Surgeon(s): Robley Fries, MD

## 2013-01-30 NOTE — Anesthesia Postprocedure Evaluation (Signed)
  Anesthesia Post-op Note  Patient: Kristin Gonzalez  Procedure(s) Performed: * No procedures listed *  Patient Location: PACU and Mother/Baby  Anesthesia Type:Spinal  Level of Consciousness: awake, alert , oriented and patient cooperative  Airway and Oxygen Therapy: Patient Spontanous Breathing  Post-op Pain: none  Post-op Assessment: Post-op Vital signs reviewed, Patient's Cardiovascular Status Stable and Respiratory Function Stable  Post-op Vital Signs: Reviewed and stable  Complications: No apparent anesthesia complications

## 2013-01-30 NOTE — Anesthesia Postprocedure Evaluation (Signed)
  Anesthesia Post-op Note  Patient: Kristin Gonzalez  Procedure(s) Performed: Procedure(s) (LRB): CESAREAN SECTION (N/A)  Patient Location: PACU  Anesthesia Type: Epidural  Level of Consciousness: awake and alert   Airway and Oxygen Therapy: Patient Spontanous Breathing  Post-op Pain: mild  Post-op Assessment: Post-op Vital signs reviewed, Patient's Cardiovascular Status Stable, Respiratory Function Stable, Patent Airway and No signs of Nausea or vomiting  Last Vitals:  Filed Vitals:   01/30/13 0600  BP: 107/59  Pulse: 88  Temp: 37.3 C  Resp: 16    Post-op Vital Signs: stable   Complications: No apparent anesthesia complications

## 2013-01-30 NOTE — Progress Notes (Signed)
T Bailey cnm at bedside discussing risks and benetits of primary c section, dr mody on her way in house.

## 2013-01-31 ENCOUNTER — Encounter (HOSPITAL_COMMUNITY): Payer: Self-pay | Admitting: Obstetrics & Gynecology

## 2013-01-31 LAB — CBC
MCHC: 33.8 g/dL (ref 30.0–36.0)
Platelets: 170 10*3/uL (ref 150–400)
RDW: 14 % (ref 11.5–15.5)
WBC: 18.3 10*3/uL — ABNORMAL HIGH (ref 4.0–10.5)

## 2013-01-31 NOTE — Progress Notes (Signed)
POD # 1  Subjective: Pt reports feeling sore/ Pain controlled with Motrin, doesn't want to use stronger med Tolerating po/Voiding without problems/ No n/v/Flatus absent Activity: ad lib, infrequentambulation Bleeding is light Newborn info:  Information for the patient's newborn:  Yareliz, Thorstenson [[id number]]  female  / Circumcision: planning outpt at [facility]/ Feeding: breast, left nipple sore with latch   Objective: VS: Temp:  [98.4 F (36.9 C)-99 F (37.2 C)] 99 F (37.2 C) (08/29 0549) Pulse Rate:  [63-99] 63 (08/29 0549) Resp:  [16-20] 16 (08/29 0549) BP: (97-106)/(46-60) 103/60 mmHg (08/29 0549) SpO2:  [93 %-94 %] 93 % (08/29 0005)  I&O: Intake/Output     08/28 0701 - 08/29 0700 08/29 0701 - 08/30 0700   I.V. (mL/kg) 658.3 (9.3)    Total Intake(mL/kg) 658.3 (9.3)    Urine (mL/kg/hr) 2000 (1.2)    Blood     Total Output 2000     Net -1341.7          Emesis Occurrence 50 x        Recent Labs  01/29/13 1155 01/31/13 0620  WBC 17.2* 18.3*  HGB 13.1 10.6*  HCT 38.2 31.4*  PLT 198 170    Blood type: --/--/O POS, O POS (08/27 1150) Rubella: Immune (08/27 1100)    Physical Exam:  General: alert and cooperative CV: Regular rate and rhythm Resp: clear Abdomen: soft, nontender, normal bowel sounds Incision: healing well, no drainage, no erythema, no swelling Uterine Fundus: firm, below umbilicus, nontender Lochia: minimal Ext: edema trace to bilateral lower extremeties and Homans sign is negative, no sign of DVT    Assessment: POD # 1/ [id number] S/P C/Section d/t failure to progress Left nipple pain Doing well  Plan: Ambulate in halls 2-3 times today Increase water and warm liquids Lactation to see for evaluation and recommendations Continue routine post op orders   Signed: Donette Larry, Dorris Carnes, MSN, CNM 01/31/2013, 10:31 AM

## 2013-02-01 MED ORDER — IBUPROFEN 600 MG PO TABS: 600.0000 mg | ORAL_TABLET | Freq: Four times a day (QID) | ORAL | Status: DC

## 2013-02-01 MED ORDER — OXYCODONE-ACETAMINOPHEN 5-325 MG PO TABS: 1.0000 | ORAL_TABLET | ORAL | Status: DC | PRN

## 2013-02-01 NOTE — Discharge Summary (Signed)
POST OPERATIVE DISCHARGE SUMMARY:  Patient ID: Kristin Gonzalez MRN: [id number] DOB/AGE: 1986/04/12 27 y.o.  Admit date: 01/29/2013 Admission Diagnoses: 40.[redacted] wks gestation, SROM   Discharge date: 02/01/13    Discharge Diagnoses: S/p cesarean section Prenatal history: G3P1021   EDC : 01/27/2013, by Other Basis  Prenatal care at [facility]  Primary provider : Dr. Juliene Pina Prenatal course complicated by low fluid, tobacco use  Prenatal Labs: ABO, Rh: O (08/27 1100) Rh: pos Antibody: NEG (08/27 1150) Rubella: Immune (08/27 1100)  RPR: NON REACTIVE (08/27 1155)  HBsAg: Negative (08/27 1100)  HIV: Non-reactive (08/27 1100)  GBS: Negative (08/27 1100)  GTT: WNL  Medical / Surgical History :  Past medical history:  Past Medical History  Diagnosis Date  . Medical history non-contributory   . Postpartum care following cesarean delivery (8/28) 01/30/2013    Past surgical history:  Past Surgical History  Procedure Laterality Date  . Cesarean section N/A 01/30/2013    Procedure: CESAREAN SECTION;  Surgeon: Robley Fries, MD;  Location: [facility];  Service: Obstetrics;  Laterality: N/A;    Family History: History reviewed. No pertinent family history.  Social History:  reports that she has quit smoking. Her smoking use included Cigarettes. She smoked 0.00 packs per day. She does not have any smokeless tobacco history on file. She reports that she does not drink alcohol or use illicit drugs.   Allergies: Review of patient's allergies indicates no known allergies.    Current Medications at time of admission:  Prior to Admission medications   Medication Sig Start Date End Date Taking? Authorizing Provider  Prenatal Vit-Fe Fumarate-FA (PRENATAL MULTIVITAMIN) TABS tablet Take 1 tablet by mouth daily at 12 noon.   Yes Historical Provider, MD  ibuprofen (ADVIL,MOTRIN) 600 MG tablet Take 1 tablet (600 mg total) by mouth every 6 (six) hours. 02/01/13   Lawernce Pitts, CNM   oxyCODONE-acetaminophen (PERCOCET/ROXICET) 5-325 MG per tablet Take 1-2 tablets by mouth every 4 (four) hours as needed. 02/01/13   Lawernce Pitts, CNM     Intrapartum Course: pitocin augmentation, epidural, arrest of labor at 9cm, cesarean section   Procedures: Cesarean section delivery of female newborn by Dr Juliene Pina on 01/30/13 See operative report for further details APGAR (1 MIN): 9   APGAR (5 MINS): 9     Postoperative / postpartum course: complicated by ABL anemia-mild  Physical Exam:   VSS: Temp:  [97.4 F (36.3 C)-97.7 F (36.5 C)] 97.4 F (36.3 C) (08/30 0610) Pulse Rate:  [74] 74 (08/30 0610) Resp:  [18] 18 (08/30 0610) BP: (99-103)/(67-69) 99/67 mmHg (08/30 0610)  LABS:  Recent Labs  01/29/13 1155 01/31/13 0620  WBC 17.2* 18.3*  HGB 13.1 10.6*  PLT 198 170    General: alert and oriented x3 Heart: RRR Lungs: clear  Abdomen: soft and non-tender / non-distended / active BS  Extremities: 2+ bilateral lower extremity edema / negative Homans  Dressing: clean, dry, intanct honeycomb dressing Incision:  approximated with subcuticular and steri strips / no erythema / no ecchymosis / no drainage  Discharge Instructions:  Discharged Condition: good Activity: pelvic rest and postoperative restrictions x 2 weeks Diet: routine Medications: PNV, Ibuprofen and Percocet   Medication List         ibuprofen 600 MG tablet  Commonly known as:  ADVIL,MOTRIN  Take 1 tablet (600 mg total) by mouth every 6 (six) hours.     oxyCODONE-acetaminophen 5-325 MG per tablet  Commonly known as:  PERCOCET/ROXICET  Take 1-2 tablets by mouth every 4 (four) hours as needed.     prenatal multivitamin Tabs tablet  Take 1 tablet by mouth daily at 12 noon.       Wound Care: Keep dressing clean and dry, remove in 1-2 days Clean incision with warm water and dry thoroughly Call the office for redness, swelling, or drainage from incision  Postpartum Instructions: [facility] discharge  booklet - instructions reviewed Discharge to: Home  Follow up : [facility] in 6 weeks for routine postpartum visit                      Signed: Donette Larry, N MSN, CNM 02/01/2013, 8:55 AM

## 2013-02-01 NOTE — Progress Notes (Signed)
POD # 2  Subjective: Pt reports feeling good/ Pain controlled with Motrin and Percocet Tolerating po/Voiding without problems/ No n/v/Flatus present Activity: ad lib Bleeding is light Newborn info:  Information for the patient's newborn:  Shonteria, Abeln [[id number]]  female  / Circumcision: planning outpt with [facility]/ Feeding: breast-going better today   Objective: VS: Temp:  [97.4 F (36.3 C)-97.7 F (36.5 C)] 97.4 F (36.3 C) (08/30 0610) Pulse Rate:  [74] 74 (08/30 0610) Resp:  [18] 18 (08/30 0610) BP: (99-103)/(67-69) 99/67 mmHg (08/30 0610)  I&O: Intake/Output     08/29 0701 - 08/30 0700 08/30 0701 - 08/31 0700   I.V. (mL/kg)     Total Intake(mL/kg)     Urine (mL/kg/hr)     Total Output       Net              LABS:  Recent Labs  01/29/13 1155 01/31/13 0620  WBC 17.2* 18.3*  HGB 13.1 10.6*  HCT 38.2 31.4*  PLT 198 170    Blood type: --/--/O POS, O POS (08/27 1150) Rubella: Immune (08/27 1100)     Physical Exam:  General: alert and cooperative CV: Regular rate and rhythm Resp: clear Abdomen: soft, nontender, normal bowel sounds Uterine Fundus: firm, below umbilicus, nontender Incision: Covered with Tegaderm and honeycomb dressing; well approximated, clean and dry. Lochia: minimal Ext: edema 1+ bilateral lower extremeties and Homans sign is negative, no sign of DVT    Assessment/: POD # 2/ G3P1021/ S/P C/Section d/t arrest of labor Doing well  Plan: Discharge home Continue routine post op orders Motrin 600 mg po q 6 hrs prn Percocet 5/325 1-2 po q 4 hrs prn [facility] 6 weeks or sooner if needed   Signed: Donette Larry, N, MSN, CNM 02/01/2013, 8:50 AM

## 2013-09-19 DIAGNOSIS — Z720 Tobacco use: Secondary | ICD-10-CM | POA: Insufficient documentation

## 2013-09-19 DIAGNOSIS — F17200 Nicotine dependence, unspecified, uncomplicated: Secondary | ICD-10-CM | POA: Insufficient documentation

## 2014-04-06 ENCOUNTER — Encounter (HOSPITAL_COMMUNITY): Payer: Self-pay | Admitting: Obstetrics & Gynecology

## 2014-06-18 ENCOUNTER — Encounter (HOSPITAL_COMMUNITY): Payer: Self-pay | Admitting: Obstetrics & Gynecology

## 2014-07-30 LAB — OB RESULTS CONSOLE HEPATITIS B SURFACE ANTIGEN: HEP B S AG: NEGATIVE

## 2014-07-30 LAB — OB RESULTS CONSOLE ANTIBODY SCREEN: Antibody Screen: NEGATIVE

## 2014-07-30 LAB — OB RESULTS CONSOLE HIV ANTIBODY (ROUTINE TESTING): HIV: NONREACTIVE

## 2014-07-30 LAB — OB RESULTS CONSOLE ABO/RH: RH TYPE: POSITIVE

## 2014-07-30 LAB — OB RESULTS CONSOLE RUBELLA ANTIBODY, IGM: RUBELLA: IMMUNE

## 2014-07-30 LAB — OB RESULTS CONSOLE RPR: RPR: NONREACTIVE

## 2014-08-13 LAB — OB RESULTS CONSOLE GC/CHLAMYDIA
CHLAMYDIA, DNA PROBE: NEGATIVE
Gonorrhea: NEGATIVE

## 2014-08-17 ENCOUNTER — Other Ambulatory Visit (HOSPITAL_COMMUNITY): Payer: Self-pay

## 2014-09-07 ENCOUNTER — Other Ambulatory Visit (HOSPITAL_COMMUNITY): Payer: Self-pay | Admitting: Obstetrics & Gynecology

## 2014-09-07 DIAGNOSIS — O289 Unspecified abnormal findings on antenatal screening of mother: Secondary | ICD-10-CM

## 2014-09-16 ENCOUNTER — Ambulatory Visit (HOSPITAL_COMMUNITY)
Admission: RE | Admit: 2014-09-16 | Discharge: 2014-09-16 | Disposition: A | Discharge status: Home / Self Care | Payer: 59 | Source: Ambulatory Visit | Attending: Obstetrics & Gynecology | Admitting: Obstetrics & Gynecology

## 2014-09-16 DIAGNOSIS — O289 Unspecified abnormal findings on antenatal screening of mother: Secondary | ICD-10-CM | POA: Insufficient documentation

## 2014-09-16 DIAGNOSIS — Z3A16 16 weeks gestation of pregnancy: Secondary | ICD-10-CM | POA: Insufficient documentation

## 2014-09-16 NOTE — Progress Notes (Signed)
  Genetic Counseling  DOB: 1985/06/24 Referring Provider: Shea Gonzalez, Vaishali, MD Appointment Date: 09/16/2014 Attending: Dr. Alpha GulaPaul Whitecar  [name] and her husband, [name], were seen for genetic counseling because of an increased risk for fetal Down syndrome based on an abnormal first trimester screen.  They were counseled regarding the First trimester screen result and the associated 1 in 131 risk for fetal Down syndrome.  We reviewed chromosomes, nondisjunction, and the common features and variable prognosis of Down syndrome.  In addition, we reviewed the screen adjusted reduction in risks for trisomy 3318 and trisomy 8013.  We also discussed other explanations for a screen positive result including: differences in maternal metabolism and normal variation. They understand that this screening is not diagnostic for Down syndrome but provides a risk assessment.  We reviewed available screening options including noninvasive prenatal screening (NIPS)/cell free fetal DNA (cffDNA) testing, and detailed ultrasound.  They were counseled that screening tests are used to modify a patient's a priori risk for aneuploidy, typically based on age. This estimate provides a pregnancy specific risk assessment. We reviewed the benefits and limitations of each option. Specifically, we discussed the conditions for which each test screens, the detection rates, and false positive rates of each. They were also counseled regarding diagnostic testing via amniocentesis. We reviewed the approximate 1 in 300-500 risk for complications from amniocentesis, including spontaneous pregnancy loss.   Kristin Gonzalez had Informaseq performed at her referring physicians office on 3/31.  Those results are still pending.  We discussed the way in which that testing is done as well as the possible results: high risk for Down syndrome, low risk for Down syndrome or inconclusive based upon a low fetal fraction.  Once those results are  available, we will review them and make a plan for the next step for Kristin Gonzalez.  She stated that she would want amniocentesis for confirmation of an abnormal result, prior to termination of pregnancy.  She may also consider amniocentesis for confirmation of a normal result as well, but her husband did not want to have amnio if the Informaseq was normal.    Both family histories were reviewed and found to be contributory.  Kristin Gonzalez reported that she has a first cousin with beta thalassemia.  We discussed that this is an autosomal recessive condition for which she would be expected to have an increased chance to be a carrier, based on this family history.  We reviewed her normal hemoglobin electrophoresis results which would suggested, based on a normal MCV and nl A2, that she is not a carrier for beta thalassemia.  The remainder of the family histories were noncontributory for birth defects, mental retardation or genetic conditions. Without further information regarding the provided family history, an accurate genetic risk cannot be calculated. Further genetic counseling is warranted if more information is obtained.  Kristin Gonzalez denied exposure to environmental toxins or chemical agents. She denied the use of alcohol, tobacco or street drugs. She denied significant viral illnesses during the course of her pregnancy. Her medical and surgical histories were noncontributory.   I counseled this couple for approximately 45 minutes regarding the above risks and available options.   Kristin Gemmaaragh Milanie Rosenfield, MS,  Certified Genetic Counselor

## 2014-09-17 ENCOUNTER — Other Ambulatory Visit (HOSPITAL_COMMUNITY): Payer: Self-pay | Admitting: Obstetrics & Gynecology

## 2014-09-25 ENCOUNTER — Ambulatory Visit (HOSPITAL_COMMUNITY): Admission: RE | Admit: 2014-09-25 | Payer: Self-pay | Source: Ambulatory Visit

## 2014-09-25 ENCOUNTER — Ambulatory Visit (HOSPITAL_COMMUNITY): Payer: Self-pay

## 2014-09-25 ENCOUNTER — Other Ambulatory Visit (HOSPITAL_COMMUNITY): Payer: Self-pay

## 2014-09-25 ENCOUNTER — Encounter (HOSPITAL_COMMUNITY): Payer: Self-pay

## 2014-09-30 ENCOUNTER — Ambulatory Visit (HOSPITAL_COMMUNITY): Payer: Self-pay

## 2014-11-12 ENCOUNTER — Encounter (HOSPITAL_COMMUNITY): Payer: Self-pay | Admitting: Obstetrics & Gynecology

## 2015-02-04 LAB — OB RESULTS CONSOLE GBS: GBS: NEGATIVE

## 2015-02-26 ENCOUNTER — Encounter (HOSPITAL_COMMUNITY)
Admission: AD | Disposition: A | Discharge status: Home / Self Care | Payer: Self-pay | Source: Ambulatory Visit | Attending: Obstetrics and Gynecology

## 2015-02-26 ENCOUNTER — Encounter (HOSPITAL_COMMUNITY): Payer: Self-pay | Admitting: *Deleted

## 2015-02-26 ENCOUNTER — Inpatient Hospital Stay (HOSPITAL_COMMUNITY): Payer: 59 | Admitting: Anesthesiology

## 2015-02-26 ENCOUNTER — Inpatient Hospital Stay (HOSPITAL_COMMUNITY)
Admission: AD | Admit: 2015-02-26 | Discharge: 2015-02-28 | DRG: 765 | Disposition: A | Discharge status: Home / Self Care | Payer: 59 | Source: Ambulatory Visit | Attending: Obstetrics and Gynecology | Admitting: Obstetrics and Gynecology

## 2015-02-26 DIAGNOSIS — O9902 Anemia complicating childbirth: Secondary | ICD-10-CM | POA: Diagnosis present

## 2015-02-26 DIAGNOSIS — Z8249 Family history of ischemic heart disease and other diseases of the circulatory system: Secondary | ICD-10-CM

## 2015-02-26 DIAGNOSIS — Z87891 Personal history of nicotine dependence: Secondary | ICD-10-CM

## 2015-02-26 DIAGNOSIS — O4292 Full-term premature rupture of membranes, unspecified as to length of time between rupture and onset of labor: Secondary | ICD-10-CM | POA: Diagnosis present

## 2015-02-26 DIAGNOSIS — O3421 Maternal care for scar from previous cesarean delivery: Secondary | ICD-10-CM | POA: Diagnosis present

## 2015-02-26 DIAGNOSIS — D62 Acute posthemorrhagic anemia: Secondary | ICD-10-CM | POA: Diagnosis not present

## 2015-02-26 DIAGNOSIS — O0903 Supervision of pregnancy with history of infertility, third trimester: Secondary | ICD-10-CM | POA: Diagnosis not present

## 2015-02-26 DIAGNOSIS — Z8349 Family history of other endocrine, nutritional and metabolic diseases: Secondary | ICD-10-CM

## 2015-02-26 DIAGNOSIS — IMO0001 Reserved for inherently not codable concepts without codable children: Secondary | ICD-10-CM

## 2015-02-26 DIAGNOSIS — Z3A39 39 weeks gestation of pregnancy: Secondary | ICD-10-CM | POA: Diagnosis present

## 2015-02-26 DIAGNOSIS — O4290 Premature rupture of membranes, unspecified as to length of time between rupture and onset of labor, unspecified weeks of gestation: Secondary | ICD-10-CM | POA: Diagnosis present

## 2015-02-26 LAB — RPR: RPR: NONREACTIVE

## 2015-02-26 LAB — CBC
HCT: 35.3 % — ABNORMAL LOW (ref 36.0–46.0)
Hemoglobin: 12.2 g/dL (ref 12.0–15.0)
MCH: 30.1 pg (ref 26.0–34.0)
MCHC: 34.6 g/dL (ref 30.0–36.0)
MCV: 87.2 fL (ref 78.0–100.0)
PLATELETS: 198 10*3/uL (ref 150–400)
RBC: 4.05 MIL/uL (ref 3.87–5.11)
RDW: 13.8 % (ref 11.5–15.5)
WBC: 14.7 10*3/uL — ABNORMAL HIGH (ref 4.0–10.5)

## 2015-02-26 LAB — TYPE AND SCREEN
ABO/RH(D): O POS
Antibody Screen: NEGATIVE

## 2015-02-26 SURGERY — Surgical Case
Anesthesia: Epidural | Site: Abdomen

## 2015-02-26 MED ORDER — EPHEDRINE 5 MG/ML INJ: 10.0000 mg | INTRAVENOUS | Status: DC | PRN

## 2015-02-26 MED ORDER — DIPHENHYDRAMINE HCL 25 MG PO CAPS: 25.0000 mg | ORAL_CAPSULE | ORAL | Status: DC | PRN

## 2015-02-26 MED ORDER — CITRIC ACID-SODIUM CITRATE 334-500 MG/5ML PO SOLN
30.0000 mL | ORAL | Status: DC | PRN
  Administered 2015-02-26: 30 mL via ORAL
  Filled 2015-02-26: qty 15

## 2015-02-26 MED ORDER — LIDOCAINE HCL (PF) 1 % IJ SOLN: 30.0000 mL | INTRAMUSCULAR | Status: DC | PRN

## 2015-02-26 MED ORDER — MORPHINE SULFATE (PF) 0.5 MG/ML IJ SOLN
INTRAMUSCULAR | Status: AC
  Filled 2015-02-26: qty 100

## 2015-02-26 MED ORDER — DIPHENHYDRAMINE HCL 50 MG/ML IJ SOLN
INTRAMUSCULAR | Status: AC
  Filled 2015-02-26: qty 1

## 2015-02-26 MED ORDER — METHYLERGONOVINE MALEATE 0.2 MG PO TABS: 0.2000 mg | ORAL_TABLET | ORAL | Status: DC | PRN

## 2015-02-26 MED ORDER — MORPHINE SULFATE (PF) 0.5 MG/ML IJ SOLN
INTRAMUSCULAR | Status: DC | PRN
  Administered 2015-02-26 (×2): .5 mg via EPIDURAL

## 2015-02-26 MED ORDER — DIPHENHYDRAMINE HCL 50 MG/ML IJ SOLN: 12.5000 mg | INTRAMUSCULAR | Status: DC | PRN

## 2015-02-26 MED ORDER — ONDANSETRON HCL 4 MG/2ML IJ SOLN
INTRAMUSCULAR | Status: DC | PRN
  Administered 2015-02-26: 4 mg via INTRAVENOUS

## 2015-02-26 MED ORDER — MEPERIDINE HCL 25 MG/ML IJ SOLN: 6.2500 mg | INTRAMUSCULAR | Status: DC | PRN

## 2015-02-26 MED ORDER — MEPERIDINE HCL 25 MG/ML IJ SOLN
INTRAMUSCULAR | Status: DC | PRN
  Administered 2015-02-26 (×2): 12.5 mg via INTRAVENOUS

## 2015-02-26 MED ORDER — ZOLPIDEM TARTRATE 5 MG PO TABS: 5.0000 mg | ORAL_TABLET | Freq: Every evening | ORAL | Status: DC | PRN

## 2015-02-26 MED ORDER — OXYCODONE-ACETAMINOPHEN 5-325 MG PO TABS: 2.0000 | ORAL_TABLET | ORAL | Status: DC | PRN

## 2015-02-26 MED ORDER — PHENYLEPHRINE 40 MCG/ML (10ML) SYRINGE FOR IV PUSH (FOR BLOOD PRESSURE SUPPORT)
80.0000 ug | PREFILLED_SYRINGE | INTRAVENOUS | Status: AC | PRN
  Administered 2015-02-26 (×8): 80 ug via INTRAVENOUS
  Administered 2015-02-26: 40 ug via INTRAVENOUS
  Filled 2015-02-26: qty 20

## 2015-02-26 MED ORDER — NALBUPHINE HCL 10 MG/ML IJ SOLN
5.0000 mg | Freq: Once | INTRAMUSCULAR | Status: AC | PRN
  Filled 2015-02-26 (×2): qty 0.5

## 2015-02-26 MED ORDER — SIMETHICONE 80 MG PO CHEW: 80.0000 mg | CHEWABLE_TABLET | ORAL | Status: DC | PRN

## 2015-02-26 MED ORDER — KETOROLAC TROMETHAMINE 30 MG/ML IJ SOLN
30.0000 mg | Freq: Four times a day (QID) | INTRAMUSCULAR | Status: DC | PRN
Start: 2015-02-26 — End: 2015-02-26

## 2015-02-26 MED ORDER — METOCLOPRAMIDE HCL 5 MG/ML IJ SOLN
INTRAMUSCULAR | Status: AC
Start: 2015-02-26 — End: 2015-02-26
  Filled 2015-02-26: qty 2

## 2015-02-26 MED ORDER — SIMETHICONE 80 MG PO CHEW
80.0000 mg | CHEWABLE_TABLET | Freq: Three times a day (TID) | ORAL | Status: DC
  Filled 2015-02-26 (×4): qty 1

## 2015-02-26 MED ORDER — SODIUM CHLORIDE 0.9 % IV SOLN: 250.0000 mL | INTRAVENOUS | Status: DC

## 2015-02-26 MED ORDER — LACTATED RINGERS IV SOLN: 500.0000 mL | INTRAVENOUS | Status: DC | PRN

## 2015-02-26 MED ORDER — LACTATED RINGERS IV SOLN
INTRAVENOUS | Status: DC
  Administered 2015-02-27: 05:00:00 via INTRAVENOUS

## 2015-02-26 MED ORDER — FLEET ENEMA 7-19 GM/118ML RE ENEM: 1.0000 | ENEMA | RECTAL | Status: DC | PRN

## 2015-02-26 MED ORDER — PHENYLEPHRINE 40 MCG/ML (10ML) SYRINGE FOR IV PUSH (FOR BLOOD PRESSURE SUPPORT)
PREFILLED_SYRINGE | INTRAVENOUS | Status: AC
  Filled 2015-02-26: qty 30

## 2015-02-26 MED ORDER — OXYTOCIN 40 UNITS IN LACTATED RINGERS INFUSION - SIMPLE MED: 62.5000 mL/h | INTRAVENOUS | Status: DC

## 2015-02-26 MED ORDER — MENTHOL 3 MG MT LOZG: 1.0000 | LOZENGE | OROMUCOSAL | Status: DC | PRN

## 2015-02-26 MED ORDER — LIDOCAINE HCL (PF) 1 % IJ SOLN
INTRAMUSCULAR | Status: DC | PRN
  Administered 2015-02-26: 2 mL via EPIDURAL
  Administered 2015-02-26: 5 mL via EPIDURAL
  Administered 2015-02-26: 3 mL via EPIDURAL

## 2015-02-26 MED ORDER — SODIUM CHLORIDE 0.9 % IJ SOLN: 3.0000 mL | INTRAMUSCULAR | Status: DC | PRN

## 2015-02-26 MED ORDER — ACETAMINOPHEN 325 MG PO TABS: 650.0000 mg | ORAL_TABLET | ORAL | Status: DC | PRN

## 2015-02-26 MED ORDER — SIMETHICONE 80 MG PO CHEW
80.0000 mg | CHEWABLE_TABLET | ORAL | Status: DC
  Filled 2015-02-26: qty 1

## 2015-02-26 MED ORDER — FLEET ENEMA 7-19 GM/118ML RE ENEM: 1.0000 | ENEMA | Freq: Every day | RECTAL | Status: DC | PRN

## 2015-02-26 MED ORDER — DIPHENHYDRAMINE HCL 25 MG PO CAPS: 25.0000 mg | ORAL_CAPSULE | Freq: Four times a day (QID) | ORAL | Status: DC | PRN

## 2015-02-26 MED ORDER — OXYCODONE-ACETAMINOPHEN 5-325 MG PO TABS
1.0000 | ORAL_TABLET | ORAL | Status: DC | PRN
Start: 2015-02-26 — End: 2015-02-28

## 2015-02-26 MED ORDER — IBUPROFEN 600 MG PO TABS
600.0000 mg | ORAL_TABLET | Freq: Four times a day (QID) | ORAL | Status: DC
  Administered 2015-02-27 – 2015-02-28 (×6): 600 mg via ORAL
  Filled 2015-02-26 (×6): qty 1

## 2015-02-26 MED ORDER — NALOXONE HCL 0.4 MG/ML IJ SOLN: 0.4000 mg | INTRAMUSCULAR | Status: DC | PRN

## 2015-02-26 MED ORDER — ACETAMINOPHEN 325 MG PO TABS
650.0000 mg | ORAL_TABLET | ORAL | Status: DC | PRN
  Administered 2015-02-27 – 2015-02-28 (×2): 650 mg via ORAL
  Filled 2015-02-26 (×2): qty 2

## 2015-02-26 MED ORDER — SCOPOLAMINE 1 MG/3DAYS TD PT72
MEDICATED_PATCH | TRANSDERMAL | Status: DC | PRN
  Administered 2015-02-26: 1 via TRANSDERMAL

## 2015-02-26 MED ORDER — LACTATED RINGERS IV SOLN
INTRAVENOUS | Status: DC | PRN
  Administered 2015-02-26 (×3): via INTRAVENOUS

## 2015-02-26 MED ORDER — WITCH HAZEL-GLYCERIN EX PADS: 1.0000 "application " | MEDICATED_PAD | CUTANEOUS | Status: DC | PRN

## 2015-02-26 MED ORDER — NALBUPHINE HCL 10 MG/ML IJ SOLN
5.0000 mg | Freq: Once | INTRAMUSCULAR | Status: AC | PRN
  Administered 2015-02-27: 5 mg via INTRAVENOUS
  Filled 2015-02-26: qty 0.5

## 2015-02-26 MED ORDER — ONDANSETRON HCL 4 MG/2ML IJ SOLN: 4.0000 mg | Freq: Three times a day (TID) | INTRAMUSCULAR | Status: DC | PRN

## 2015-02-26 MED ORDER — NALBUPHINE HCL 10 MG/ML IJ SOLN
5.0000 mg | INTRAMUSCULAR | Status: DC | PRN
  Filled 2015-02-26: qty 0.5

## 2015-02-26 MED ORDER — SCOPOLAMINE 1 MG/3DAYS TD PT72
1.0000 | MEDICATED_PATCH | Freq: Once | TRANSDERMAL | Status: DC
  Filled 2015-02-26: qty 1

## 2015-02-26 MED ORDER — TERBUTALINE SULFATE 1 MG/ML IJ SOLN: 0.2500 mg | Freq: Once | INTRAMUSCULAR | Status: DC | PRN

## 2015-02-26 MED ORDER — FERROUS SULFATE 325 (65 FE) MG PO TABS
325.0000 mg | ORAL_TABLET | Freq: Two times a day (BID) | ORAL | Status: DC
  Administered 2015-02-27 – 2015-02-28 (×3): 325 mg via ORAL
  Filled 2015-02-26 (×3): qty 1

## 2015-02-26 MED ORDER — SENNOSIDES-DOCUSATE SODIUM 8.6-50 MG PO TABS
2.0000 | ORAL_TABLET | ORAL | Status: DC
  Administered 2015-02-28: 2 via ORAL
  Filled 2015-02-26: qty 2

## 2015-02-26 MED ORDER — DIBUCAINE 1 % RE OINT
1.0000 | TOPICAL_OINTMENT | RECTAL | Status: DC | PRN
Start: 2015-02-26 — End: 2015-02-28

## 2015-02-26 MED ORDER — LANOLIN HYDROUS EX OINT: 1.0000 "application " | TOPICAL_OINTMENT | CUTANEOUS | Status: DC | PRN

## 2015-02-26 MED ORDER — METOCLOPRAMIDE HCL 5 MG/ML IJ SOLN
INTRAMUSCULAR | Status: DC | PRN
  Administered 2015-02-26: 10 mg via INTRAVENOUS

## 2015-02-26 MED ORDER — METHYLERGONOVINE MALEATE 0.2 MG/ML IJ SOLN: 0.2000 mg | INTRAMUSCULAR | Status: DC | PRN

## 2015-02-26 MED ORDER — BISACODYL 10 MG RE SUPP: 10.0000 mg | Freq: Every day | RECTAL | Status: DC | PRN

## 2015-02-26 MED ORDER — CEFAZOLIN SODIUM-DEXTROSE 2-3 GM-% IV SOLR
INTRAVENOUS | Status: DC | PRN
  Administered 2015-02-26: 2 g via INTRAVENOUS

## 2015-02-26 MED ORDER — OXYTOCIN 40 UNITS IN LACTATED RINGERS INFUSION - SIMPLE MED
1.0000 m[IU]/min | INTRAVENOUS | Status: DC
  Administered 2015-02-26: 1 m[IU]/min via INTRAVENOUS
  Administered 2015-02-26: 3 m[IU]/min via INTRAVENOUS
  Filled 2015-02-26: qty 1000

## 2015-02-26 MED ORDER — BUPIVACAINE HCL (PF) 0.25 % IJ SOLN
INTRAMUSCULAR | Status: AC
Start: 2015-02-26 — End: 2015-02-26
  Filled 2015-02-26: qty 30

## 2015-02-26 MED ORDER — PRENATAL MULTIVITAMIN CH
1.0000 | ORAL_TABLET | Freq: Every day | ORAL | Status: DC
  Administered 2015-02-27: 1 via ORAL
  Filled 2015-02-26 (×2): qty 1

## 2015-02-26 MED ORDER — KETOROLAC TROMETHAMINE 30 MG/ML IJ SOLN: 30.0000 mg | Freq: Four times a day (QID) | INTRAMUSCULAR | Status: DC | PRN

## 2015-02-26 MED ORDER — OXYTOCIN 10 UNIT/ML IJ SOLN
40.0000 [IU] | INTRAVENOUS | Status: DC | PRN
  Administered 2015-02-26: 40 [IU] via INTRAVENOUS

## 2015-02-26 MED ORDER — FENTANYL CITRATE (PF) 100 MCG/2ML IJ SOLN: 25.0000 ug | INTRAMUSCULAR | Status: DC | PRN

## 2015-02-26 MED ORDER — OXYTOCIN BOLUS FROM INFUSION: 500.0000 mL | INTRAVENOUS | Status: DC

## 2015-02-26 MED ORDER — BUPIVACAINE HCL (PF) 0.25 % IJ SOLN
INTRAMUSCULAR | Status: DC | PRN
  Administered 2015-02-26: 8 mL

## 2015-02-26 MED ORDER — FENTANYL 2.5 MCG/ML BUPIVACAINE 1/10 % EPIDURAL INFUSION (WH - ANES)
14.0000 mL/h | INTRAMUSCULAR | Status: DC | PRN
  Administered 2015-02-26: 14 mL/h via EPIDURAL
  Filled 2015-02-26: qty 125

## 2015-02-26 MED ORDER — OXYCODONE-ACETAMINOPHEN 5-325 MG PO TABS: 1.0000 | ORAL_TABLET | ORAL | Status: DC | PRN

## 2015-02-26 MED ORDER — DIPHENHYDRAMINE HCL 50 MG/ML IJ SOLN
INTRAMUSCULAR | Status: DC | PRN
  Administered 2015-02-26: 25 mg via INTRAVENOUS

## 2015-02-26 MED ORDER — NALOXONE HCL 1 MG/ML IJ SOLN
1.0000 ug/kg/h | INTRAVENOUS | Status: DC | PRN
  Filled 2015-02-26: qty 2

## 2015-02-26 MED ORDER — MEPERIDINE HCL 25 MG/ML IJ SOLN
INTRAMUSCULAR | Status: AC
  Filled 2015-02-26: qty 1

## 2015-02-26 MED ORDER — OXYTOCIN 10 UNIT/ML IJ SOLN
INTRAMUSCULAR | Status: AC
  Filled 2015-02-26: qty 4

## 2015-02-26 MED ORDER — LACTATED RINGERS IV SOLN
INTRAVENOUS | Status: DC
  Administered 2015-02-26 (×3): via INTRAVENOUS

## 2015-02-26 MED ORDER — OXYTOCIN 40 UNITS IN LACTATED RINGERS INFUSION - SIMPLE MED: 62.5000 mL/h | INTRAVENOUS | Status: AC

## 2015-02-26 MED ORDER — LIDOCAINE-EPINEPHRINE (PF) 2 %-1:200000 IJ SOLN
INTRAMUSCULAR | Status: DC | PRN
  Administered 2015-02-26 (×2): 5 mL via EPIDURAL

## 2015-02-26 MED ORDER — SODIUM CHLORIDE 0.9 % IJ SOLN: 3.0000 mL | Freq: Two times a day (BID) | INTRAMUSCULAR | Status: DC

## 2015-02-26 MED ORDER — ONDANSETRON HCL 4 MG/2ML IJ SOLN
INTRAMUSCULAR | Status: AC
  Filled 2015-02-26: qty 2

## 2015-02-26 MED ORDER — ONDANSETRON HCL 4 MG/2ML IJ SOLN: 4.0000 mg | Freq: Four times a day (QID) | INTRAMUSCULAR | Status: DC | PRN

## 2015-02-26 MED ORDER — OXYTOCIN 40 UNITS IN LACTATED RINGERS INFUSION - SIMPLE MED: 1.0000 m[IU]/min | INTRAVENOUS | Status: DC

## 2015-02-26 SURGICAL SUPPLY — 45 items
APL SKNCLS STERI-STRIP NONHPOA (GAUZE/BANDAGES/DRESSINGS) ×1
BARRIER ADHS 3X4 INTERCEED (GAUZE/BANDAGES/DRESSINGS) ×3 IMPLANT
BENZOIN TINCTURE PRP APPL 2/3 (GAUZE/BANDAGES/DRESSINGS) ×2 IMPLANT
BRR ADH 4X3 ABS CNTRL BYND (GAUZE/BANDAGES/DRESSINGS) ×1
CLAMP CORD UMBIL (MISCELLANEOUS) IMPLANT
CLOSURE WOUND 1/2 X4 (GAUZE/BANDAGES/DRESSINGS) ×1
CLOTH BEACON ORANGE TIMEOUT ST (SAFETY) ×3 IMPLANT
CONTAINER PREFILL 10% NBF 15ML (MISCELLANEOUS) IMPLANT
DRAPE C SECTION CLR SCREEN (DRAPES) ×3 IMPLANT
DRAPE SHEET LG 3/4 BI-LAMINATE (DRAPES) IMPLANT
DRSG OPSITE POSTOP 4X10 (GAUZE/BANDAGES/DRESSINGS) ×3 IMPLANT
DURAPREP 26ML APPLICATOR (WOUND CARE) ×3 IMPLANT
ELECT REM PT RETURN 9FT ADLT (ELECTROSURGICAL) ×3
ELECTRODE REM PT RTRN 9FT ADLT (ELECTROSURGICAL) ×1 IMPLANT
EXTRACTOR VACUUM M CUP 4 TUBE (SUCTIONS) IMPLANT
EXTRACTOR VACUUM M CUP 4' TUBE (SUCTIONS)
GLOVE BIOGEL PI IND STRL 7.0 (GLOVE) ×1 IMPLANT
GLOVE BIOGEL PI INDICATOR 7.0 (GLOVE) ×2
GLOVE ECLIPSE 6.5 STRL STRAW (GLOVE) ×3 IMPLANT
GOWN STRL REUS W/TWL LRG LVL3 (GOWN DISPOSABLE) ×6 IMPLANT
KIT ABG SYR 3ML LUER SLIP (SYRINGE) IMPLANT
NDL HYPO 25X5/8 SAFETYGLIDE (NEEDLE) IMPLANT
NEEDLE HYPO 22GX1.5 SAFETY (NEEDLE) ×3 IMPLANT
NEEDLE HYPO 25X5/8 SAFETYGLIDE (NEEDLE) IMPLANT
NS IRRIG 1000ML POUR BTL (IV SOLUTION) ×3 IMPLANT
PACK C SECTION WH (CUSTOM PROCEDURE TRAY) ×3 IMPLANT
PAD OB MATERNITY 4.3X12.25 (PERSONAL CARE ITEMS) ×3 IMPLANT
PENCIL SMOKE EVAC W/HOLSTER (ELECTROSURGICAL) ×3 IMPLANT
RTRCTR C-SECT PINK 25CM LRG (MISCELLANEOUS) ×2 IMPLANT
STRIP CLOSURE SKIN 1/2X4 (GAUZE/BANDAGES/DRESSINGS) ×1 IMPLANT
SUT CHROMIC GUT AB #0 18 (SUTURE) IMPLANT
SUT MNCRL 0 VIOLET CTX 36 (SUTURE) ×3 IMPLANT
SUT MON AB 4-0 PS1 27 (SUTURE) IMPLANT
SUT MONOCRYL 0 CTX 36 (SUTURE) ×4
SUT PLAIN 2 0 (SUTURE)
SUT PLAIN 2 0 XLH (SUTURE) IMPLANT
SUT PLAIN ABS 2-0 CT1 27XMFL (SUTURE) IMPLANT
SUT VIC AB 0 CT1 36 (SUTURE) ×6 IMPLANT
SUT VIC AB 2-0 CT1 27 (SUTURE) ×3
SUT VIC AB 2-0 CT1 TAPERPNT 27 (SUTURE) ×1 IMPLANT
SUT VIC AB 4-0 PS2 27 (SUTURE) IMPLANT
SUT VICRYL 4-0 PS2 18IN ABS (SUTURE) ×2 IMPLANT
SYR CONTROL 10ML LL (SYRINGE) ×3 IMPLANT
TOWEL OR 17X24 6PK STRL BLUE (TOWEL DISPOSABLE) ×3 IMPLANT
TRAY FOLEY CATH SILVER 14FR (SET/KITS/TRAYS/PACK) IMPLANT

## 2015-02-26 NOTE — MAU Note (Signed)
PT  SAYS   SHE WAS  CLEANING  AND  FELT  GUSH   AT 0015.  VE - 1-2  CM  DENIES HSV AND MRSA.    GBS- NEG.    FIRST BABY   WAS C/S  FOR  FTP AND DISTRESS.       THIS BABY  IS VBAC.

## 2015-02-26 NOTE — H&P (Signed)
Kristin Gonzalez is a 29 y.o. female presenting at 39.2 wks with SROM at , clear fluid. No bleeding. Good FMs. No UCs. Prior C/s x1 at term for arrest of dilatation, VBAC desired.  [facility]/ Dr Juliene Pina. Ultrascreen abn for Downs (but normal NT and NB), Informaseq normal (Amniocentesis deferred), Normal Anatomy sono and f/up interval growth, low placenta at 17 wks, resolved at 28 wks.  TWB 35 lbs.   History OB History    Gravida Para Term Preterm AB TAB SAB Ectopic Multiple Living   Past Medical History  Diagnosis Date  . Medical history non-contributory   . Postpartum care following cesarean delivery (8/28) 01/30/2013   Past Surgical History  Procedure Laterality Date  . Cesarean section N/A 01/30/2013    Procedure: CESAREAN SECTION;  Surgeon: Robley Fries, MD;  Location: [facility];  Service: Obstetrics;  Laterality: N/A;   Family History: family history includes Hyperlipidemia in her mother; Hypertension in her father. Social History:  reports that she has quit smoking. Her smoking use included Cigarettes. She does not have any smokeless tobacco history on file. She reports that she does not drink alcohol or use illicit drugs.   Prenatal Transfer Tool  Maternal Diabetes: No Genetic Screening: Abnormal:  Results: Elevated risk of Trisomy 21 on Ultrascreen but Informaseq was normal.  Maternal Ultrasounds/Referrals: Normal Fetal Ultrasounds or other Referrals:  None Maternal Substance Abuse:  No Significant Maternal Medications:  None Significant Maternal Lab Results:  Lab values include: Group B Strep negative Other Comments:  None  ROS neg   Dilation: 3.5 Effacement (%): 70 Station: -3 Exam by:: E. Siska, RN Blood pressure 97/54, pulse 88, temperature 97.6 F (36.4 C), temperature source Oral, resp. rate 18, height  (1.473 m), weight 143 lb 4 oz (64.978 kg), SpO2 100 %, unknown if currently breastfeeding. Exam Physical Exam  Physical exam:  A&O  x 3, no acute distress. Pleasant HEENT neg, no thyromegaly Lungs CTA bilat CV RRR, S1S2 normal Abdo soft, non tender, non acute Extr no edema/ tenderness Pelvic above FHT 135/ + accels/ no decels/ mod variab- reassuring. Category I Toco irreg   Prenatal labs: ABO, Rh: --/--/O POS (09/23 0235) Antibody: NEG (09/23 0235) Rubella: Immune (02/25 0000) RPR: Nonreactive (02/25 0000)  HBsAg: Negative (02/25 0000)  HIV: Non-reactive (02/25 0000)  GBS: Negative (09/01 0000)  1hr Glucola 147, declined 3hr GTT but home Fasting, 2hrPP were normal range for 1 wk, stopped after that.  Ultrascreen abn for Down's 1:131 risk, Normal NT, NB seen. Informaseq Normal.  .  Assessment/Plan: 29 yo G4P1021 at 39.2 wks, SROM. Desires VBAC. GBS(-). EFW 7 lbs, FHT- I.  Labor AOL with low dose pitocin and can titrate up as needed with close monitoring. Epidural advised in case of urgent C/section or if labor failed. Extensive VBAC counseling in office. Patient accepts RC/s as clinically indicated.  Okay with ALL Providers for C/s but prefers female for VBAC.   MODY,VAISHALI R 02/26/2015, 7:04 AM

## 2015-02-26 NOTE — Progress Notes (Signed)
S: denies pelvic pressure  O: pitocin Epidural VE 10/100/-1/-2   Asynclitic  Tracing: baseline 135-140 Ctx q 2-4 mins IUPC placed then removed due to twisting  IMP: complete Previous C/S Term  Prolonged ROM Disc issue at hand: need to assess expulsion strength in order to increase pitocin and to effect a vaginal delivery. Pt declines pitocin increase. Wants to proceed with repeat C/S P) Repeat C/S. Risk of surgery reviewed including infection, bleeding, injury to bladder, bowel, ureter, internal scar tissue . Consent signed. OR notified

## 2015-02-26 NOTE — Progress Notes (Signed)
Kristin Gonzalez is a 29 y.o. [id number] admitted with SROM (12.30 am), on pitocin since 4 am, regular UCs, no pain, no meds. GBS(-). Still anxious about wanting to know if she should try VTOL or RC/s. No fetal/ maternal concerns now, early labor. Husband now in town (back from [place])  Objective: BP 118/56 mmHg  Pulse 80  Temp(Src) 97.8 F (36.6 C) (Oral)  Resp 16  Ht  (1.473 m)  Wt 143 lb 4 oz (64.978 kg)  BMI 29.95 kg/m2  SpO2 100%    FHT:  FHR: 135-140 bpm, variability: moderate,  accelerations:  Present,  decelerations:  Absent UC:   regular, every 3 minutes SVE:   Dilation: 3 Effacement (%): 50 Station: -3 Exam by:: DR. Juliene Pina High station at the inlet, unflexed head. Mid pelvis and outlet seem adequate but watch for inlet dystocia   Assessment / Plan: Early labor 39.2 wks, here for VTOL  with one prior LTCS. Continue pitocin. Watch descent from inlet. FHT-I. EFW 6.1/2 lbs. Pt will discuss with her husband if she wants to continue vaginal attempt or not since she does not want the same situation as last time when after hours of labor (without epid) she landed up with C-section (labor arrrested at 5 cm). Dicussed again that VTOL does NOT guarantee vaginal birth and she has to wait until failed labor/ descent or distress but she can decide for RC/s as well since she does have the choice.  She was informed that Dr Cherly Hensen is on call and I'll sign her out at 5 pm.   Devaris Quirk R 02/26/2015, 2:17 PM

## 2015-02-26 NOTE — Op Note (Signed)
NAME:  Kristin Gonzalez, Kristin Gonzalez                 ACCOUNT NO.:  [id number]  MEDICAL RECORD NO.:  [id number]  LOCATION:  [facility]                          FACILITY:  [facility]  PHYSICIAN:  Maxie Better, M.D.DATE OF BIRTH:  [birth year]  DATE OF PROCEDURE:  02/26/2015 DATE OF DISCHARGE:                              OPERATIVE REPORT   PREOPERATIVE DIAGNOSES:  Previous cesarean section.  Failed trial of labor.  Arrest of descent.  POSTOPERATIVE DIAGNOSES:  Previous cesarean section.  Failed trial of labor.  Arrest of descent.  Deep transverse arrest.  PROCEDURE:  Repeat cesarean section, Kerr hysterotomy.  ANESTHESIA:  Epidural.  SURGEON:  Maxie Better, M.D.  ASSISTANT:  Leta Baptist, CNM.  DESCRIPTION OF PROCEDURE:  Under adequate epidural anesthesia, the patient was placed in a supine position with a left lateral tilt.  She was sterilely prepped and draped in usual fashion.  Indwelling Foley catheter was replaced.  0.25% Marcaine was injected along the previous Pfannenstiel skin incision site.  Pfannenstiel skin incision was then made, carried down to the rectus fascia.  Rectus fascia opened transversely.  The rectus fascia then bluntly and sharply dissected off the rectus muscle in superior and inferior fashion.  The rectus muscle was split in midline.  The parietal peritoneum was entered bluntly and extended.  A self-retaining Alexis retractor was then placed. Vesicouterine peritoneum was opened transversely.  The bladder was then gently and bluntly dissected off the lower uterine segment and displaced inferiorly.  A curvilinear low transverse uterine incision was then made and extended with bandage scissors.  Subsequent delivery of a live female from the right occiput transverse position was accomplished.  The baby was bulb suctioned abdomen.  The cord was clamped, cut.  The baby was transferred to the awaiting pediatrician who assigned Apgars 8 and 9 at 1 and 5 minutes.  The placenta was  posteriorly and manually removed intact, not sent.  Uterine cavity was cleaned of debris.  Uterine incision had no extension, was closed in 2 layers, the first layer with 0 Monocryl running locked stitch, second layer with imbricated using 0 Monocryl suture.  Good hemostasis was achieved.  Abdomen was irrigated and suctioned of debris.  Normal tubes and ovaries were noted bilaterally.  The retractor was then removed.  Interceed was placed overlying the lower uterine segment and the parietal peritoneum was closed with 2-0 Vicryl.  The rectus fascia was closed with 0 Vicryl x2. The subcutaneous area was irrigated, small bleeders cauterized. Interrupted 2-0 plain sutures placed and the skin approximated using 4-0 Vicryl subcuticular sutures.  SPECIMENS:  Placenta not sent to Pathology.  ESTIMATED BLOOD LOSS:  700 mL.  INTRAOPERATIVE FLUID:  2 L.  URINE OUTPUT:  150 mL of some bloody urine thought to be related to replacement of the Foley catheter with trauma with the low vertex.  Sponge instrument counts x2 was correct.  COMPLICATION:  None.  The patient tolerated the procedure well, was transferred to recovery room in stable condition.  The baby was placed on skin to skin.     Maxie Better, M.D.     [place]/MEDQ  D:  02/26/2015  T:  02/26/2015  Job:  [id number]

## 2015-02-26 NOTE — Anesthesia Preprocedure Evaluation (Addendum)
Anesthesia Evaluation  Patient identified by MRN, date of birth, ID band Patient awake    Reviewed: Allergy & Precautions, NPO status , Patient's Chart, lab work & pertinent test results  History of Anesthesia Complications Negative for: history of anesthetic complications  Airway Mallampati: II  TM Distance: >3 FB Neck ROM: Full    Dental  (+) Teeth Intact, Dental Advisory Given   Pulmonary former smoker,    Pulmonary exam normal breath sounds clear to auscultation       Cardiovascular Exercise Tolerance: Good (-) hypertensionnegative cardio ROS Normal cardiovascular exam Rhythm:Regular Rate:Normal     Neuro/Psych negative neurological ROS  negative psych ROS   GI/Hepatic negative GI ROS, Neg liver ROS,   Endo/Other  negative endocrine ROS  Renal/GU negative Renal ROS     Musculoskeletal negative musculoskeletal ROS (+)   Abdominal   Peds  Hematology negative hematology ROS (+)   Anesthesia Other Findings Day of surgery medications reviewed with the patient.  Reproductive/Obstetrics (+) Pregnancy                           Anesthesia Physical Anesthesia Plan  ASA: II  Anesthesia Plan: Epidural   Post-op Pain Management:    Induction: Intravenous  Airway Management Planned: Nasal Cannula  Additional Equipment: None  Intra-op Plan:   Post-operative Plan:   Informed Consent: I have reviewed the patients History and Physical, chart, labs and discussed the procedure including the risks, benefits and alternatives for the proposed anesthesia with the patient or authorized representative who has indicated his/her understanding and acceptance.   Dental advisory given  Plan Discussed with: CRNA and Surgeon  Anesthesia Plan Comments: (Patient identified. Risks/Benefits/Options discussed with patient including but not limited to bleeding, infection, nerve damage, paralysis, failed  block, incomplete pain control, headache, blood pressure changes, nausea, vomiting, reactions to medication both or allergic, itching and postpartum back pain. Confirmed with bedside nurse the patient's most recent platelet count. Confirmed with patient that they are not currently taking any anticoagulation, have any bleeding history or any family history of bleeding disorders. Patient expressed understanding and wished to proceed. All questions were answered. )       Anesthesia Quick Evaluation

## 2015-02-26 NOTE — Progress Notes (Addendum)
Kristin Gonzalez is a 29 y.o. [id number] at [redacted]w[redacted]d by LMP c/w sono, admitted for SROM at 12.30 am today. No UCs. On pitocin since admission, now at 6 mu rate, pt didn't allow increase of pitocin last night (was at 2 units from 4 am to 7.30 am) since husband was in [place] and wants him to be there for birth. After counseling she is okay with increasing per protocol, now at 6 mu.   Objective: BP 101/49 mmHg  Pulse 71  Temp(Src) 97.9 F (36.6 C) (Oral)  Resp 16  Ht  (1.473 m)  Wt 143 lb 4 oz (64.978 kg)  BMI 29.95 kg/m2  SpO2 100%   FHT:  FHR: 140 bpm, variability: moderate,  accelerations:  Present,  decelerations:  Absent UC:   regular, every 5 minutes, non tender, soft in b/w contractions.  Vtx by Thayer Ohm and is floating/unengaged  SVE:   Dilation: 3.5 Effacement (%): 70 Station: -3 Exam by:: E. Siska, RN Repeat Exam deferred since SROM 10 hrs, clear fluid, no bleeding  Labs: Lab Results  Component Value Date   WBC 14.7* 02/26/2015   HGB 12.2 02/26/2015   HCT 35.3* 02/26/2015   MCV 87.2 02/26/2015   PLT 198 02/26/2015    Assessment / Plan: 39.2 wks, prior LTCS, now STOM since 12.30 am today, GBS(-), FHT- catogeroy I, ballotable head. Inadequate UCs, RN will increase now at 2x2 rate (was at 1x1) since patient in latent labor. Patient still asking me if she should attempt VTOL or go for RC/s. Options and risks/ benefits reviewed. Recommend reassessing in active labor but she has the choice to ask for RC/s.  MODY,VAISHALI R 02/26/2015, 10:55 AM

## 2015-02-26 NOTE — Brief Op Note (Signed)
02/26/2015  8:41 PM  PATIENT:  Kristin Gonzalez  29 y.o. female  PRE-OPERATIVE DIAGNOSIS:  Failed Trial of Labor after cesarean, arrest of descent, term gestation  POST-OPERATIVE DIAGNOSIS:  Failed Trial of labor after cesarean, arrest of descent, term gestation  PROCEDURE:  Repeat cesarean section, kerr hysterotomy  SURGEON:  Surgeon(s) and Role:    * Maxie Better, MD - Primary  PHYSICIAN ASSISTANT:   ASSISTANTS: Raelyn Mora, CNM   ANESTHESIA:   epidural   FIndings: live female LOT wedged in pelvis, nl tubes and ovaries, 6lb  EBL:  Total I/O In: 2000 [I.V.:2000] Out: 850 [Urine:150; Blood:700]  BLOOD ADMINISTERED:none  DRAINS: none   LOCAL MEDICATIONS USED:  MARCAINE     SPECIMEN:  Source of Specimen:  placenta  DISPOSITION OF SPECIMEN:  N/A  COUNTS:  YES  TOURNIQUET:  * No tourniquets in log *  DICTATION: .Other Dictation: Dictation Number [id number]  PLAN OF CARE: Admit to inpatient   PATIENT DISPOSITION:  PACU - hemodynamically stable.   Delay start of Pharmacological VTE agent (>24hrs) due to surgical blood loss or risk of bleeding: no

## 2015-02-26 NOTE — Transfer of Care (Signed)
Immediate Anesthesia Transfer of Care Note  Patient: Kristin Gonzalez  Procedure(s) Performed: Procedure(s): Repeat CESAREAN SECTION (N/A)  Patient Location: PACU  Anesthesia Type:Epidural  Level of Consciousness: awake, alert  and oriented  Airway & Oxygen Therapy: Patient Spontanous Breathing  Post-op Assessment: Report given to RN and Post -op Vital signs reviewed and stable  Post vital signs: Reviewed and stable  Last Vitals:  Filed Vitals:   02/26/15 1831  BP: 94/52  Pulse: 69  Temp:   Resp:     Complications: No apparent anesthesia complications

## 2015-02-26 NOTE — Anesthesia Postprocedure Evaluation (Signed)
  Anesthesia Post-op Note  Patient: Kristin Gonzalez  Procedure(s) Performed: Procedure(s): Repeat CESAREAN SECTION (N/A)  Patient Location: PACU  Anesthesia Type:Epidural  Level of Consciousness: awake  Airway and Oxygen Therapy: Patient Spontanous Breathing  Post-op Pain: mild  Post-op Assessment: Post-op Vital signs reviewed, Patient's Cardiovascular Status Stable, Respiratory Function Stable, Patent Airway, No signs of Nausea or vomiting and Pain level controlled LLE Motor Response: Purposeful movement LLE Sensation: Tingling RLE Motor Response: Purposeful movement RLE Sensation: Tingling      Post-op Vital Signs: Reviewed and stable  Last Vitals:  Filed Vitals:   02/26/15 2212  BP: 109/48  Pulse: 63  Temp: 37.1 C  Resp: 16    Complications: No apparent anesthesia complications

## 2015-02-26 NOTE — Anesthesia Procedure Notes (Signed)
Epidural Patient location during procedure: OB  Staffing Anesthesiologist: TURK, STEPHEN EDWARD Performed by: anesthesiologist   Preanesthetic Checklist Completed: patient identified, pre-op evaluation, timeout performed, IV checked, risks and benefits discussed and monitors and equipment checked  Epidural Patient position: sitting Prep: DuraPrep Patient monitoring: blood pressure and continuous pulse ox Approach: midline Location: L3-L4 Injection technique: LOR air  Needle:  Needle type: Tuohy  Needle gauge: 17 G Needle length: 9 cm Needle insertion depth: 4 cm Catheter size: 19 Gauge Catheter at skin depth: 9 cm Test dose: negative and Other (1% Lidocaine)  Additional Notes Patient identified.  Risk benefits discussed including failed block, incomplete pain control, headache, nerve damage, paralysis, blood pressure changes, nausea, vomiting, reactions to medication both toxic or allergic, and postpartum back pain.  Patient expressed understanding and wished to proceed.  All questions were answered.  Sterile technique used throughout procedure and epidural site dressed with sterile barrier dressing. No paresthesia or other complications noted. The patient did not experience any signs of intravascular injection such as tinnitus or metallic taste in mouth nor signs of intrathecal spread such as rapid motor block. Please see nursing notes for vital signs. Reason for block:procedure for pain   

## 2015-02-27 ENCOUNTER — Encounter (HOSPITAL_COMMUNITY): Payer: Self-pay

## 2015-02-27 LAB — CBC
HCT: 31.5 % — ABNORMAL LOW (ref 36.0–46.0)
HEMOGLOBIN: 10.8 g/dL — AB (ref 12.0–15.0)
MCH: 30.4 pg (ref 26.0–34.0)
MCHC: 34.3 g/dL (ref 30.0–36.0)
MCV: 88.7 fL (ref 78.0–100.0)
Platelets: 167 10*3/uL (ref 150–400)
RBC: 3.55 MIL/uL — AB (ref 3.87–5.11)
RDW: 13.8 % (ref 11.5–15.5)
WBC: 20.6 10*3/uL — ABNORMAL HIGH (ref 4.0–10.5)

## 2015-02-27 MED ORDER — MAGNESIUM 200 MG PO TABS
200.0000 mg | ORAL_TABLET | Freq: Every day | ORAL | Status: DC
  Administered 2015-02-27 – 2015-02-28 (×2): 200 mg via ORAL
  Filled 2015-02-27 (×2): qty 1

## 2015-02-27 MED ORDER — LACTATED RINGERS IV SOLN: INTRAVENOUS | Status: DC

## 2015-02-27 NOTE — Progress Notes (Signed)
POD # 1  Subjective: Pt reports feeling well/ Pain controlled with Motrin and long acting spinal narcotic Tolerating po/ Foley in place /No n/v/ Flatus present Activity: up with assistance Bleeding is light Newborn info:  Information for the patient's newborn:  Mareesa, Gathright Girl Omaira [[id number]]  female   Feeding: breast  Objective:  VS:  Filed Vitals:   02/27/15 0158 02/27/15 0203 02/27/15 0436 02/27/15 0740  BP:   Pulse:  56 53 52  Temp:  98.1 F (36.7 C) 98.8 F (37.1 C) 98.2 F (36.8 C)  TempSrc:  Oral Oral Oral  Resp:  Height:      Weight:      SpO2: 95% 95% 96% 93%     I&O: Intake/Output      09/23 0701 - 09/24 0700 09/24 0701 - 09/25 0700   I.V. (mL/kg) 2125 (32.7) 500 (7.7)   Total Intake(mL/kg) 2125 (32.7) 500 (7.7)   Urine (mL/kg/hr) 1025 (0.7) 50 (0.2)   Blood 700 (0.4)    Total Output 1725 50   Net +400 +450           Recent Labs  02/26/15 0235 02/27/15 0530  WBC 14.7* 20.6*  HGB 12.2 10.8*  HCT 35.3* 31.5*  PLT 198 167    Blood type: --/--/O POS (09/23 0235) Rubella: Immune (02/25 0000)    Physical Exam:  General: alert, cooperative and no distress CV: Regular rate and rhythm Resp: CTA bilaterally Abdomen: soft, nontender, normal bowel sounds Incision: Covered with Tegaderm and honeycomb dressing; no significant drainage, edema, bruising, or erythema; well approximated with suture Uterine Fundus: firm, below umbilicus, nontender Lochia: minimal GU: foley to SD, clear amber Ext: extremities normal, atraumatic, no cyanosis or edema and Homans sign is negative, no sign of DVT   Assessment: POD # 1/ G4P2022/ S/P repeat C/Section d/t arrest of descent, failed TOLAC  ABL anemia Doing well  Plan: Ambulate Discontinue foley and IV Continue routine post op orders Consider early discharge tomorrow (pt requested)   Signed: Donette Larry, N, MSN, CNM 02/27/2015, 10:25 AM

## 2015-02-27 NOTE — Anesthesia Postprocedure Evaluation (Signed)
Anesthesia Post Note  Patient: Kristin Gonzalez  Procedure(s) Performed: Procedure(s) (LRB): Repeat CESAREAN SECTION (N/A)  Anesthesia type: Epidural  Patient location: Mother/Baby  Post pain: Pain level controlled  Post assessment: Post-op Vital signs reviewed  Last Vitals:  Filed Vitals:   02/27/15 0740  BP: 93/46  Pulse: 52  Temp: 36.8 C  Resp: 18    Post vital signs: Reviewed  Level of consciousness:alert  Complications: No apparent anesthesia complications

## 2015-02-27 NOTE — Lactation Note (Signed)
This note was copied from the chart of Kristin Laquenta Nunnery. Lactation Consultation Note  Assisted mom with a deeper latch to relieve tenderness.  This was successful.  Hand expression taught with colostrum visible. Information given on support groups and outpatient services. Follow-up tomorrow.   Patient Name: Kristin Gonzalez Today's Date: 02/27/2015 Reason for consult: Initial assessment   Maternal Data Has patient been taught Hand Expression?: Yes Does the patient have breastfeeding experience prior to this delivery?: Yes  Feeding Feeding Type: Breast Fed Length of feed: 10 min  LATCH Score/Interventions Latch: Repeated attempts needed to sustain latch, nipple held in mouth throughout feeding, stimulation needed to elicit sucking reflex.  Audible Swallowing: A few with stimulation  Type of Nipple: Everted at rest and after stimulation  Comfort (Breast/Nipple): Soft / non-tender     Hold (Positioning): Assistance needed to correctly position infant at breast and maintain latch.  LATCH Score: 7  Lactation Tools Discussed/Used     Consult Status Consult Status: Follow-up Date: 02/28/15 Follow-up type: In-patient    Soyla Dryer 02/27/2015, 3:22 PM

## 2015-02-27 NOTE — Addendum Note (Signed)
Addendum  created 02/27/15 0936 by Graciela Husbands, CRNA   Modules edited: Notes Section   Notes Section:  File: [id number]

## 2015-02-28 MED ORDER — OXYCODONE-ACETAMINOPHEN 5-325 MG PO TABS: 1.0000 | ORAL_TABLET | ORAL | Status: DC | PRN

## 2015-02-28 MED ORDER — IBUPROFEN 600 MG PO TABS: 600.0000 mg | ORAL_TABLET | Freq: Four times a day (QID) | ORAL | Status: DC

## 2015-02-28 NOTE — Discharge Summary (Signed)
POSTOPERATIVE DISCHARGE SUMMARY:  Patient ID: Kristin Gonzalez MRN: [id number] DOB/AGE: 11-01-1985 29 y.o.  Admit date: 02/26/2015 Admission Diagnoses: 39.2 weeks / onset of labor / previous cesarean section / TOLAC  Discharge date:  02/28/2015  Discharge Diagnoses: POD 2 s/p repeat cesarean section / failed TOLAC / mild ABL anemia  Prenatal history: [id number]   EDC : 03/03/2015, by Other Basis  Prenatal care at [facility]  Primary provider : Mody Prenatal course complicated by previous CS / IDA of pregnancy  Prenatal Labs: ABO, Rh: --/--/O POS (09/23 0235)  Antibody: NEG (09/23 0235) Rubella: Immune (02/25 0000)   RPR: Non Reactive (09/23 0235)  HBsAg: Negative (02/25 0000)  HIV: Non-reactive (02/25 0000)  GTT : NL GBS: Negative (09/01 0000)   Medical / Surgical History :  Past medical history:  Past Medical History  Diagnosis Date  . Medical history non-contributory   . Postpartum care following cesarean delivery (8/28) 01/30/2013  . Postpartum care following cesarean delivery (9/23) 02/26/2015  . Postpartum care following cesarean delivery (9/23) 02/26/2015    Past surgical history:  Past Surgical History  Procedure Laterality Date  . Cesarean section N/A 01/30/2013    Procedure: CESAREAN SECTION;  Surgeon: Robley Fries, MD;  Location: [facility];  Service: Obstetrics;  Laterality: N/A;    Family History:  Family History  Problem Relation Age of Onset  . Hyperlipidemia Mother   . Hypertension Father     Social History:  reports that she has quit smoking. Her smoking use included Cigarettes. She does not have any smokeless tobacco history on file. She reports that she does not drink alcohol or use illicit drugs.  Allergies: Review of patient's allergies indicates no known allergies.   Current Medications at time of admission:  Prior to Admission medications   Medication Sig Start Date End Date Taking? Authorizing Provider  Docosahexaenoic Acid (DHA  PO) Take 1 tablet by mouth daily.   Yes Historical Provider, MD  Prenatal Vit-Fe Fumarate-FA (PRENATAL MULTIVITAMIN) TABS tablet Take 1 tablet by mouth daily at 12 noon.   Yes Historical Provider, MD    Intrapartum Course:  Admit for  labor with labor progression to complete dilation with arrest of descent at -1 Pain management: epidural Complicated by: arrest of descent Interventions required: repeat cesarean section  Procedures: Cesarean section delivery on 02/26/2015 with delivery of viable female newborn by Dr Cherly Hensen   See operative report for further details APGAR (1 MIN): 8   APGAR (5 MINS): 9    Postoperative / postpartum course:  Uncomplicated with discharge on POD 2  Discharge Instructions:  Discharged Condition: stable  Activity: pelvic rest and postoperative restrictions x 2   Diet: routine  Medications:    Medication List    TAKE these medications        DHA PO  Take 1 tablet by mouth daily.     ibuprofen 600 MG tablet  Commonly known as:  ADVIL,MOTRIN  Take 1 tablet (600 mg total) by mouth every 6 (six) hours.     ibuprofen 600 MG tablet  Commonly known as:  ADVIL,MOTRIN  Take 1 tablet (600 mg total) by mouth every 6 (six) hours.     oxyCODONE-acetaminophen 5-325 MG per tablet  Commonly known as:  PERCOCET/ROXICET  Take 1 tablet by mouth every 4 (four) hours as needed (for pain scale 4-7).     prenatal multivitamin Tabs tablet  Take 1 tablet by mouth daily at 12 noon.  Wound Care: keep clean and dry / remove honeycomb POD 3-4 Postpartum Instructions: [facility] discharge booklet - instructions reviewed  Discharge to: Home  Follow up :  [facility] in 6 weeks for routine postpartum visit with Dr Juliene Pina                Signed: Marlinda Mike CNM, MSN, [facility] 02/28/2015, 9:16 AM

## 2015-02-28 NOTE — Progress Notes (Signed)
POSTOPERATIVE DAY # 2 S/P CS for arrest of descent - deep transverse arrest  S:         Reports feeling well - desires DC today             Tolerating po intake / no nausea / no vomiting / + flatus / no BM             Bleeding is light             Pain controlled with motrin and Tylenol - no narcotics             Up ad lib / ambulatory/ voiding QS  Newborn breast feeding  / female  O:  VS: BP 108/63 mmHg  Pulse 51  Temp(Src) 98 F (36.7 C) (Oral)  Resp 16  Ht  (1.473 m)  Wt 64.978 kg (143 lb 4 oz)  BMI 29.95 kg/m2  SpO2 99%  Breastfeeding? Unknown   LABS:               Recent Labs  02/26/15 0235 02/27/15 0530  WBC 14.7* 20.6*  HGB 12.2 10.8*  PLT 198 167               Bloodtype: --/--/O POS (09/23 0235)  Rubella: Immune (02/25 0000)               tdap and flu update today                              Physical Exam:             Alert and Oriented X3  Lungs: Clear and unlabored  Heart: regular rate and rhythm / no mumurs  Abdomen: soft, non-tender, non-distended, active BS             Fundus: firm, non-tender, Ueven             Dressing intact honeycomb              Incision:  approximated with suture / no erythema / no ecchymosis / no drainage  Perineum: intact  Lochia: light  Extremities: trace edema, no calf pain or tenderness, negative Homans  A:        POD # 2 S/P CS - arrest of descent            Mild ABL anemia  P:        Routine postoperative care              Dc home - [facility] booklet - instructions reviewed    Marlinda Mike CNM, MSN, FACNM 02/28/2015, 8:16 AM

## 2015-03-01 ENCOUNTER — Encounter (HOSPITAL_COMMUNITY): Payer: Self-pay | Admitting: Obstetrics and Gynecology

## 2015-04-07 ENCOUNTER — Ambulatory Visit (HOSPITAL_COMMUNITY): Payer: 59

## 2015-12-16 DIAGNOSIS — R109 Unspecified abdominal pain: Secondary | ICD-10-CM | POA: Diagnosis not present

## 2015-12-16 DIAGNOSIS — Z1389 Encounter for screening for other disorder: Secondary | ICD-10-CM | POA: Diagnosis not present

## 2015-12-16 DIAGNOSIS — Z87891 Personal history of nicotine dependence: Secondary | ICD-10-CM | POA: Diagnosis not present

## 2015-12-16 DIAGNOSIS — F17200 Nicotine dependence, unspecified, uncomplicated: Secondary | ICD-10-CM | POA: Diagnosis not present

## 2015-12-16 DIAGNOSIS — E559 Vitamin D deficiency, unspecified: Secondary | ICD-10-CM | POA: Diagnosis not present

## 2015-12-16 DIAGNOSIS — Z0000 Encounter for general adult medical examination without abnormal findings: Secondary | ICD-10-CM | POA: Diagnosis not present

## 2015-12-16 DIAGNOSIS — R5383 Other fatigue: Secondary | ICD-10-CM | POA: Diagnosis not present

## 2015-12-16 DIAGNOSIS — R0602 Shortness of breath: Secondary | ICD-10-CM | POA: Diagnosis not present

## 2015-12-30 DIAGNOSIS — E559 Vitamin D deficiency, unspecified: Secondary | ICD-10-CM | POA: Diagnosis not present

## 2015-12-30 DIAGNOSIS — Z87891 Personal history of nicotine dependence: Secondary | ICD-10-CM | POA: Diagnosis not present

## 2015-12-30 DIAGNOSIS — E7800 Pure hypercholesterolemia, unspecified: Secondary | ICD-10-CM | POA: Diagnosis not present

## 2015-12-30 DIAGNOSIS — F17200 Nicotine dependence, unspecified, uncomplicated: Secondary | ICD-10-CM | POA: Diagnosis not present

## 2015-12-30 MED FILL — NICOTROL CARTRIDGE INHALER: 10 | 30 days supply | Qty: 168 | Fill #0

## 2016-01-14 DIAGNOSIS — H52223 Regular astigmatism, bilateral: Secondary | ICD-10-CM | POA: Diagnosis not present

## 2016-01-14 DIAGNOSIS — H524 Presbyopia: Secondary | ICD-10-CM | POA: Diagnosis not present

## 2016-01-14 DIAGNOSIS — H5213 Myopia, bilateral: Secondary | ICD-10-CM | POA: Diagnosis not present

## 2016-04-18 DIAGNOSIS — Z01419 Encounter for gynecological examination (general) (routine) without abnormal findings: Secondary | ICD-10-CM | POA: Diagnosis not present

## 2016-06-05 HISTORY — PX: WISDOM TOOTH EXTRACTION: SHX21

## 2016-09-21 DIAGNOSIS — Z3046 Encounter for surveillance of implantable subdermal contraceptive: Secondary | ICD-10-CM | POA: Diagnosis not present

## 2016-11-17 DIAGNOSIS — J029 Acute pharyngitis, unspecified: Secondary | ICD-10-CM | POA: Diagnosis not present

## 2016-11-17 DIAGNOSIS — J028 Acute pharyngitis due to other specified organisms: Secondary | ICD-10-CM | POA: Diagnosis not present

## 2016-12-05 DIAGNOSIS — R05 Cough: Secondary | ICD-10-CM | POA: Diagnosis not present

## 2016-12-26 ENCOUNTER — Ambulatory Visit: Payer: Self-pay | Admitting: Allergy and Immunology

## 2017-02-01 DIAGNOSIS — F17210 Nicotine dependence, cigarettes, uncomplicated: Secondary | ICD-10-CM | POA: Diagnosis not present

## 2017-02-02 DIAGNOSIS — R112 Nausea with vomiting, unspecified: Secondary | ICD-10-CM | POA: Diagnosis not present

## 2017-02-06 ENCOUNTER — Ambulatory Visit: Payer: Self-pay | Admitting: Allergy and Immunology

## 2017-02-06 DIAGNOSIS — R319 Hematuria, unspecified: Secondary | ICD-10-CM | POA: Diagnosis not present

## 2017-02-06 DIAGNOSIS — N390 Urinary tract infection, site not specified: Secondary | ICD-10-CM | POA: Diagnosis not present

## 2017-02-06 DIAGNOSIS — Z3200 Encounter for pregnancy test, result unknown: Secondary | ICD-10-CM | POA: Diagnosis not present

## 2017-02-15 DIAGNOSIS — Z136 Encounter for screening for cardiovascular disorders: Secondary | ICD-10-CM | POA: Diagnosis not present

## 2017-02-15 DIAGNOSIS — R800 Isolated proteinuria: Secondary | ICD-10-CM | POA: Diagnosis not present

## 2017-02-15 DIAGNOSIS — Z5181 Encounter for therapeutic drug level monitoring: Secondary | ICD-10-CM | POA: Diagnosis not present

## 2017-02-15 DIAGNOSIS — R309 Painful micturition, unspecified: Secondary | ICD-10-CM | POA: Diagnosis not present

## 2017-02-28 DIAGNOSIS — Z3201 Encounter for pregnancy test, result positive: Secondary | ICD-10-CM | POA: Diagnosis not present

## 2017-04-06 DIAGNOSIS — Z3481 Encounter for supervision of other normal pregnancy, first trimester: Secondary | ICD-10-CM | POA: Diagnosis not present

## 2017-04-06 DIAGNOSIS — Z118 Encounter for screening for other infectious and parasitic diseases: Secondary | ICD-10-CM | POA: Diagnosis not present

## 2017-04-06 DIAGNOSIS — Z3491 Encounter for supervision of normal pregnancy, unspecified, first trimester: Secondary | ICD-10-CM | POA: Diagnosis not present

## 2017-04-06 DIAGNOSIS — Z3689 Encounter for other specified antenatal screening: Secondary | ICD-10-CM | POA: Diagnosis not present

## 2017-04-06 DIAGNOSIS — Z36 Encounter for antenatal screening for chromosomal anomalies: Secondary | ICD-10-CM | POA: Diagnosis not present

## 2017-04-06 LAB — OB RESULTS CONSOLE RPR: RPR: NONREACTIVE

## 2017-04-06 LAB — OB RESULTS CONSOLE RUBELLA ANTIBODY, IGM: Rubella: IMMUNE

## 2017-04-06 LAB — OB RESULTS CONSOLE HEPATITIS B SURFACE ANTIGEN: HEP B S AG: NEGATIVE

## 2017-04-06 LAB — OB RESULTS CONSOLE ABO/RH: RH Type: POSITIVE

## 2017-04-06 LAB — OB RESULTS CONSOLE GC/CHLAMYDIA
CHLAMYDIA, DNA PROBE: NEGATIVE
Gonorrhea: NEGATIVE

## 2017-04-06 LAB — OB RESULTS CONSOLE ANTIBODY SCREEN: ANTIBODY SCREEN: NEGATIVE

## 2017-04-06 LAB — OB RESULTS CONSOLE HIV ANTIBODY (ROUTINE TESTING): HIV: NONREACTIVE

## 2017-04-20 DIAGNOSIS — Z3682 Encounter for antenatal screening for nuchal translucency: Secondary | ICD-10-CM | POA: Diagnosis not present

## 2017-05-15 DIAGNOSIS — Z361 Encounter for antenatal screening for raised alphafetoprotein level: Secondary | ICD-10-CM | POA: Diagnosis not present

## 2017-06-01 DIAGNOSIS — Z363 Encounter for antenatal screening for malformations: Secondary | ICD-10-CM | POA: Diagnosis not present

## 2017-07-11 DIAGNOSIS — O365920 Maternal care for other known or suspected poor fetal growth, second trimester, not applicable or unspecified: Secondary | ICD-10-CM | POA: Diagnosis not present

## 2017-07-11 DIAGNOSIS — Z3A24 24 weeks gestation of pregnancy: Secondary | ICD-10-CM | POA: Diagnosis not present

## 2017-08-09 DIAGNOSIS — Z3689 Encounter for other specified antenatal screening: Secondary | ICD-10-CM | POA: Diagnosis not present

## 2017-08-09 DIAGNOSIS — Z23 Encounter for immunization: Secondary | ICD-10-CM | POA: Diagnosis not present

## 2017-08-30 ENCOUNTER — Other Ambulatory Visit: Payer: Self-pay | Admitting: Obstetrics and Gynecology

## 2017-10-03 DIAGNOSIS — Z3685 Encounter for antenatal screening for Streptococcus B: Secondary | ICD-10-CM | POA: Diagnosis not present

## 2017-10-03 LAB — OB RESULTS CONSOLE GBS: STREP GROUP B AG: NEGATIVE

## 2017-10-09 DIAGNOSIS — O365930 Maternal care for other known or suspected poor fetal growth, third trimester, not applicable or unspecified: Secondary | ICD-10-CM | POA: Diagnosis not present

## 2017-10-09 DIAGNOSIS — Z3A37 37 weeks gestation of pregnancy: Secondary | ICD-10-CM | POA: Diagnosis not present

## 2017-10-11 ENCOUNTER — Telehealth (HOSPITAL_COMMUNITY): Payer: Self-pay | Admitting: *Deleted

## 2017-10-11 ENCOUNTER — Encounter (HOSPITAL_COMMUNITY): Payer: Self-pay | Admitting: *Deleted

## 2017-10-11 NOTE — Telephone Encounter (Signed)
Preadmission screen  

## 2017-10-16 ENCOUNTER — Encounter (HOSPITAL_COMMUNITY): Payer: Self-pay

## 2017-10-16 ENCOUNTER — Telehealth (HOSPITAL_COMMUNITY): Payer: Self-pay | Admitting: *Deleted

## 2017-10-16 DIAGNOSIS — Z3A38 38 weeks gestation of pregnancy: Secondary | ICD-10-CM | POA: Diagnosis not present

## 2017-10-16 DIAGNOSIS — O365930 Maternal care for other known or suspected poor fetal growth, third trimester, not applicable or unspecified: Secondary | ICD-10-CM | POA: Diagnosis not present

## 2017-10-16 NOTE — Telephone Encounter (Signed)
Preadmission screen  

## 2017-10-22 DIAGNOSIS — Z3A38 38 weeks gestation of pregnancy: Secondary | ICD-10-CM | POA: Diagnosis not present

## 2017-10-22 DIAGNOSIS — O365930 Maternal care for other known or suspected poor fetal growth, third trimester, not applicable or unspecified: Secondary | ICD-10-CM | POA: Diagnosis not present

## 2017-10-22 NOTE — Patient Instructions (Signed)
Kristin Gonzalez  10/22/2017   Your procedure is scheduled on:  10/24/2017  Enter through the Main Entrance of [facility] at 1230 AM.  Pick up the phone at the desk and dial [phone]  Call this number if you have problems the morning of [phone]  Remember:   Do not eat food:(After Midnight) Desps de medianoche.  Do not drink clear liquids: (6 Hours before arrival) 6 horas ante llegada.  Take these medicines the morning of surgery with A SIP OF WATER: none   Do not wear jewelry, make-up or nail polish.  Do not wear lotions, powders, or perfumes. Do not wear deodorant.  Do not shave 48 hours prior to surgery.  Do not bring valuables to the hospital.  [facility] is not   responsible for any belongings or valuables brought to the hospital.  Contacts, dentures or bridgework may not be worn into surgery.  Leave suitcase in the car. After surgery it may be brought to your room.  For patients admitted to the hospital, checkout time is 11:00 AM the day of              discharge.    N/A   Please read over the following fact sheets that you were given:   Surgical Site Infection Prevention

## 2017-10-23 ENCOUNTER — Encounter (HOSPITAL_COMMUNITY)
Admission: RE | Admit: 2017-10-23 | Discharge: 2017-10-23 | Disposition: A | Discharge status: Home / Self Care | Payer: 59 | Source: Ambulatory Visit | Attending: Obstetrics and Gynecology | Admitting: Obstetrics and Gynecology

## 2017-10-23 DIAGNOSIS — L299 Pruritus, unspecified: Secondary | ICD-10-CM | POA: Diagnosis not present

## 2017-10-23 DIAGNOSIS — Z87891 Personal history of nicotine dependence: Secondary | ICD-10-CM | POA: Diagnosis not present

## 2017-10-23 DIAGNOSIS — Z3A39 39 weeks gestation of pregnancy: Secondary | ICD-10-CM | POA: Diagnosis not present

## 2017-10-23 DIAGNOSIS — O26893 Other specified pregnancy related conditions, third trimester: Secondary | ICD-10-CM | POA: Diagnosis not present

## 2017-10-23 DIAGNOSIS — O34211 Maternal care for low transverse scar from previous cesarean delivery: Secondary | ICD-10-CM | POA: Diagnosis not present

## 2017-10-23 HISTORY — DX: Vitamin D deficiency, unspecified: E55.9

## 2017-10-23 LAB — CBC
HCT: 36.3 % (ref 36.0–46.0)
Hemoglobin: 12.3 g/dL (ref 12.0–15.0)
MCH: 29.1 pg (ref 26.0–34.0)
MCHC: 33.9 g/dL (ref 30.0–36.0)
MCV: 86 fL (ref 78.0–100.0)
PLATELETS: 215 10*3/uL (ref 150–400)
RBC: 4.22 MIL/uL (ref 3.87–5.11)
RDW: 13.2 % (ref 11.5–15.5)
WBC: 12.8 10*3/uL — ABNORMAL HIGH (ref 4.0–10.5)

## 2017-10-23 LAB — TYPE AND SCREEN
ABO/RH(D): O POS
Antibody Screen: NEGATIVE

## 2017-10-24 ENCOUNTER — Encounter (HOSPITAL_COMMUNITY): Payer: Self-pay | Admitting: *Deleted

## 2017-10-24 ENCOUNTER — Encounter (HOSPITAL_COMMUNITY)
Admission: AD | Disposition: A | Discharge status: Home / Self Care | Payer: Self-pay | Source: Ambulatory Visit | Attending: Obstetrics and Gynecology

## 2017-10-24 ENCOUNTER — Other Ambulatory Visit: Payer: Self-pay

## 2017-10-24 ENCOUNTER — Inpatient Hospital Stay (HOSPITAL_COMMUNITY)
Admission: AD | Admit: 2017-10-24 | Discharge: 2017-10-26 | DRG: 788 | Disposition: A | Discharge status: Home / Self Care | Payer: 59 | Source: Ambulatory Visit | Attending: Obstetrics and Gynecology | Admitting: Obstetrics and Gynecology

## 2017-10-24 ENCOUNTER — Inpatient Hospital Stay (HOSPITAL_COMMUNITY): Payer: 59 | Admitting: Anesthesiology

## 2017-10-24 DIAGNOSIS — Z87891 Personal history of nicotine dependence: Secondary | ICD-10-CM | POA: Diagnosis not present

## 2017-10-24 DIAGNOSIS — Z3A39 39 weeks gestation of pregnancy: Secondary | ICD-10-CM | POA: Diagnosis not present

## 2017-10-24 DIAGNOSIS — O34211 Maternal care for low transverse scar from previous cesarean delivery: Principal | ICD-10-CM | POA: Diagnosis present

## 2017-10-24 DIAGNOSIS — Z3A00 Weeks of gestation of pregnancy not specified: Secondary | ICD-10-CM | POA: Diagnosis not present

## 2017-10-24 DIAGNOSIS — O26893 Other specified pregnancy related conditions, third trimester: Secondary | ICD-10-CM | POA: Diagnosis present

## 2017-10-24 DIAGNOSIS — L299 Pruritus, unspecified: Secondary | ICD-10-CM | POA: Diagnosis present

## 2017-10-24 DIAGNOSIS — O34219 Maternal care for unspecified type scar from previous cesarean delivery: Secondary | ICD-10-CM | POA: Diagnosis not present

## 2017-10-24 LAB — RPR: RPR: NONREACTIVE

## 2017-10-24 SURGERY — Surgical Case
Anesthesia: Spinal

## 2017-10-24 MED ORDER — DEXTROSE 5 % IV SOLN
INTRAVENOUS | Status: DC | PRN
  Administered 2017-10-24: 60 ug/min via INTRAVENOUS

## 2017-10-24 MED ORDER — OXYTOCIN 10 UNIT/ML IJ SOLN
INTRAVENOUS | Status: DC | PRN
  Administered 2017-10-24: 40 [IU] via INTRAVENOUS

## 2017-10-24 MED ORDER — DIPHENHYDRAMINE HCL 50 MG/ML IJ SOLN
INTRAMUSCULAR | Status: AC
  Filled 2017-10-24: qty 1

## 2017-10-24 MED ORDER — BUPIVACAINE HCL (PF) 0.25 % IJ SOLN
INTRAMUSCULAR | Status: DC | PRN
  Administered 2017-10-24: 8.5 mL

## 2017-10-24 MED ORDER — KETOROLAC TROMETHAMINE 30 MG/ML IJ SOLN: 30.0000 mg | Freq: Four times a day (QID) | INTRAMUSCULAR | Status: DC | PRN

## 2017-10-24 MED ORDER — SCOPOLAMINE 1 MG/3DAYS TD PT72
1.0000 | MEDICATED_PATCH | Freq: Once | TRANSDERMAL | Status: DC
  Filled 2017-10-24: qty 1

## 2017-10-24 MED ORDER — EPHEDRINE SULFATE-NACL 50-0.9 MG/10ML-% IV SOSY
PREFILLED_SYRINGE | INTRAVENOUS | Status: DC | PRN
  Administered 2017-10-24: 10 mg via INTRAVENOUS

## 2017-10-24 MED ORDER — SCOPOLAMINE 1 MG/3DAYS TD PT72
MEDICATED_PATCH | TRANSDERMAL | Status: AC
  Filled 2017-10-24: qty 1

## 2017-10-24 MED ORDER — MORPHINE SULFATE (PF) 0.5 MG/ML IJ SOLN
INTRAMUSCULAR | Status: AC
  Filled 2017-10-24: qty 10

## 2017-10-24 MED ORDER — DIPHENHYDRAMINE HCL 25 MG PO CAPS
25.0000 mg | ORAL_CAPSULE | Freq: Four times a day (QID) | ORAL | Status: DC | PRN
  Filled 2017-10-24: qty 1

## 2017-10-24 MED ORDER — IBUPROFEN 600 MG PO TABS
600.0000 mg | ORAL_TABLET | Freq: Four times a day (QID) | ORAL | Status: DC
  Administered 2017-10-24 – 2017-10-26 (×6): 600 mg via ORAL
  Filled 2017-10-24 (×7): qty 1

## 2017-10-24 MED ORDER — CEFAZOLIN SODIUM-DEXTROSE 2-4 GM/100ML-% IV SOLN
2.0000 g | INTRAVENOUS | Status: AC
  Administered 2017-10-24: 2 g via INTRAVENOUS

## 2017-10-24 MED ORDER — DIPHENHYDRAMINE HCL 25 MG PO CAPS
25.0000 mg | ORAL_CAPSULE | ORAL | Status: DC | PRN
  Administered 2017-10-25: 25 mg via ORAL
  Filled 2017-10-24: qty 1

## 2017-10-24 MED ORDER — PHENYLEPHRINE 8 MG IN D5W 100 ML (0.08MG/ML) PREMIX OPTIME
INJECTION | INTRAVENOUS | Status: AC
  Filled 2017-10-24: qty 100

## 2017-10-24 MED ORDER — LACTATED RINGERS IV SOLN
INTRAVENOUS | Status: DC
  Administered 2017-10-24: 22:00:00 via INTRAVENOUS

## 2017-10-24 MED ORDER — KETOROLAC TROMETHAMINE 30 MG/ML IJ SOLN
INTRAMUSCULAR | Status: AC
Start: 2017-10-24 — End: 2017-10-25
  Filled 2017-10-24: qty 1

## 2017-10-24 MED ORDER — BUPIVACAINE HCL (PF) 0.25 % IJ SOLN
INTRAMUSCULAR | Status: AC
  Filled 2017-10-24: qty 20

## 2017-10-24 MED ORDER — BUPIVACAINE IN DEXTROSE 0.75-8.25 % IT SOLN
INTRATHECAL | Status: DC | PRN
  Administered 2017-10-24: 1.4 mg via INTRATHECAL

## 2017-10-24 MED ORDER — SCOPOLAMINE 1 MG/3DAYS TD PT72
MEDICATED_PATCH | TRANSDERMAL | Status: DC | PRN
  Administered 2017-10-24: 1 via TRANSDERMAL

## 2017-10-24 MED ORDER — ONDANSETRON HCL 4 MG/2ML IJ SOLN
INTRAMUSCULAR | Status: DC | PRN
  Administered 2017-10-24: 4 mg via INTRAVENOUS

## 2017-10-24 MED ORDER — PHENYLEPHRINE HCL 10 MG/ML IJ SOLN: INTRAMUSCULAR | Status: DC | PRN

## 2017-10-24 MED ORDER — NALBUPHINE HCL 10 MG/ML IJ SOLN
5.0000 mg | INTRAMUSCULAR | Status: DC | PRN
  Administered 2017-10-24: 5 mg via INTRAVENOUS
  Filled 2017-10-24: qty 1

## 2017-10-24 MED ORDER — SIMETHICONE 80 MG PO CHEW
80.0000 mg | CHEWABLE_TABLET | ORAL | Status: DC
  Administered 2017-10-24 – 2017-10-25 (×2): 80 mg via ORAL
  Filled 2017-10-24 (×2): qty 1

## 2017-10-24 MED ORDER — NALBUPHINE HCL 10 MG/ML IJ SOLN: 5.0000 mg | Freq: Once | INTRAMUSCULAR | Status: DC | PRN

## 2017-10-24 MED ORDER — PHENYLEPHRINE 40 MCG/ML (10ML) SYRINGE FOR IV PUSH (FOR BLOOD PRESSURE SUPPORT): PREFILLED_SYRINGE | INTRAVENOUS | Status: DC | PRN

## 2017-10-24 MED ORDER — PRENATAL MULTIVITAMIN CH
1.0000 | ORAL_TABLET | Freq: Every day | ORAL | Status: DC
  Administered 2017-10-25 – 2017-10-26 (×2): 1 via ORAL
  Filled 2017-10-24 (×2): qty 1

## 2017-10-24 MED ORDER — LACTATED RINGERS IV SOLN
INTRAVENOUS | Status: DC | PRN
  Administered 2017-10-24: 15:00:00 via INTRAVENOUS

## 2017-10-24 MED ORDER — MEPERIDINE HCL 25 MG/ML IJ SOLN: 6.2500 mg | INTRAMUSCULAR | Status: DC | PRN

## 2017-10-24 MED ORDER — SIMETHICONE 80 MG PO CHEW: 80.0000 mg | CHEWABLE_TABLET | ORAL | Status: DC | PRN

## 2017-10-24 MED ORDER — OXYTOCIN 40 UNITS IN LACTATED RINGERS INFUSION - SIMPLE MED: 2.5000 [IU]/h | INTRAVENOUS | Status: AC

## 2017-10-24 MED ORDER — SODIUM CHLORIDE 0.9 % IR SOLN
Status: DC | PRN
  Administered 2017-10-24: 1000 mL

## 2017-10-24 MED ORDER — ONDANSETRON HCL 4 MG/2ML IJ SOLN
INTRAMUSCULAR | Status: AC
  Filled 2017-10-24: qty 2

## 2017-10-24 MED ORDER — STERILE WATER FOR IRRIGATION IR SOLN
Status: DC | PRN
  Administered 2017-10-24: 1000 mL

## 2017-10-24 MED ORDER — SODIUM CHLORIDE 0.9% FLUSH: 3.0000 mL | INTRAVENOUS | Status: DC | PRN

## 2017-10-24 MED ORDER — FENTANYL CITRATE (PF) 100 MCG/2ML IJ SOLN
INTRAMUSCULAR | Status: AC
  Filled 2017-10-24: qty 2

## 2017-10-24 MED ORDER — NALBUPHINE HCL 10 MG/ML IJ SOLN
INTRAMUSCULAR | Status: AC
  Filled 2017-10-24: qty 1

## 2017-10-24 MED ORDER — SENNOSIDES-DOCUSATE SODIUM 8.6-50 MG PO TABS
2.0000 | ORAL_TABLET | ORAL | Status: DC
  Administered 2017-10-24 – 2017-10-25 (×2): 2 via ORAL
  Filled 2017-10-24 (×2): qty 2

## 2017-10-24 MED ORDER — OXYCODONE-ACETAMINOPHEN 5-325 MG PO TABS: 2.0000 | ORAL_TABLET | ORAL | Status: DC | PRN

## 2017-10-24 MED ORDER — MORPHINE SULFATE (PF) 0.5 MG/ML IJ SOLN
INTRAMUSCULAR | Status: DC | PRN
  Administered 2017-10-24: .2 mg via INTRATHECAL
  Administered 2017-10-24: .2 mg via EPIDURAL

## 2017-10-24 MED ORDER — FENTANYL CITRATE (PF) 100 MCG/2ML IJ SOLN: 25.0000 ug | INTRAMUSCULAR | Status: DC | PRN

## 2017-10-24 MED ORDER — COCONUT OIL OIL
1.0000 "application " | TOPICAL_OIL | Status: DC | PRN
  Administered 2017-10-24: 1 via TOPICAL
  Filled 2017-10-24 (×2): qty 120

## 2017-10-24 MED ORDER — DIPHENHYDRAMINE HCL 50 MG/ML IJ SOLN
12.5000 mg | INTRAMUSCULAR | Status: DC | PRN
  Administered 2017-10-24: 12.5 mg via INTRAVENOUS

## 2017-10-24 MED ORDER — ONDANSETRON HCL 4 MG/2ML IJ SOLN: 4.0000 mg | Freq: Three times a day (TID) | INTRAMUSCULAR | Status: DC | PRN

## 2017-10-24 MED ORDER — EPHEDRINE 5 MG/ML INJ
INTRAVENOUS | Status: AC
  Filled 2017-10-24: qty 10

## 2017-10-24 MED ORDER — LACTATED RINGERS IV SOLN
INTRAVENOUS | Status: DC
  Administered 2017-10-24 (×3): via INTRAVENOUS

## 2017-10-24 MED ORDER — MENTHOL 3 MG MT LOZG: 1.0000 | LOZENGE | OROMUCOSAL | Status: DC | PRN

## 2017-10-24 MED ORDER — SIMETHICONE 80 MG PO CHEW
80.0000 mg | CHEWABLE_TABLET | Freq: Three times a day (TID) | ORAL | Status: DC
  Administered 2017-10-25 – 2017-10-26 (×4): 80 mg via ORAL
  Filled 2017-10-24 (×4): qty 1

## 2017-10-24 MED ORDER — WITCH HAZEL-GLYCERIN EX PADS: 1.0000 "application " | MEDICATED_PAD | CUTANEOUS | Status: DC | PRN

## 2017-10-24 MED ORDER — DIBUCAINE 1 % RE OINT: 1.0000 "application " | TOPICAL_OINTMENT | RECTAL | Status: DC | PRN

## 2017-10-24 MED ORDER — NALOXONE HCL 0.4 MG/ML IJ SOLN: 0.4000 mg | INTRAMUSCULAR | Status: DC | PRN

## 2017-10-24 MED ORDER — OXYTOCIN 10 UNIT/ML IJ SOLN
INTRAMUSCULAR | Status: AC
  Filled 2017-10-24: qty 4

## 2017-10-24 MED ORDER — FENTANYL CITRATE (PF) 100 MCG/2ML IJ SOLN
INTRAMUSCULAR | Status: DC | PRN
  Administered 2017-10-24: 10 ug via INTRATHECAL
  Administered 2017-10-24: 10 ug via INTRAVENOUS

## 2017-10-24 MED ORDER — OXYCODONE-ACETAMINOPHEN 5-325 MG PO TABS: 1.0000 | ORAL_TABLET | ORAL | Status: DC | PRN

## 2017-10-24 MED ORDER — NALBUPHINE HCL 10 MG/ML IJ SOLN
5.0000 mg | INTRAMUSCULAR | Status: DC | PRN
  Administered 2017-10-24: 5 mg via SUBCUTANEOUS

## 2017-10-24 MED ORDER — BUPIVACAINE HCL (PF) 0.25 % IJ SOLN
INTRAMUSCULAR | Status: AC
  Filled 2017-10-24: qty 10

## 2017-10-24 MED ORDER — ZOLPIDEM TARTRATE 5 MG PO TABS: 5.0000 mg | ORAL_TABLET | Freq: Every evening | ORAL | Status: DC | PRN

## 2017-10-24 MED ORDER — NALOXONE HCL 4 MG/10ML IJ SOLN: 1.0000 ug/kg/h | INTRAVENOUS | Status: DC | PRN

## 2017-10-24 MED ORDER — ACETAMINOPHEN 500 MG PO TABS
1000.0000 mg | ORAL_TABLET | Freq: Four times a day (QID) | ORAL | Status: AC
  Administered 2017-10-24 – 2017-10-25 (×3): 1000 mg via ORAL
  Filled 2017-10-24 (×4): qty 2

## 2017-10-24 MED ORDER — KETOROLAC TROMETHAMINE 30 MG/ML IJ SOLN
30.0000 mg | Freq: Four times a day (QID) | INTRAMUSCULAR | Status: DC | PRN
  Administered 2017-10-24: 30 mg via INTRAMUSCULAR

## 2017-10-24 SURGICAL SUPPLY — 48 items
APL SKNCLS STERI-STRIP NONHPOA (GAUZE/BANDAGES/DRESSINGS) ×1
BARRIER ADHS 3X4 INTERCEED (GAUZE/BANDAGES/DRESSINGS) ×5 IMPLANT
BENZOIN TINCTURE PRP APPL 2/3 (GAUZE/BANDAGES/DRESSINGS) ×2 IMPLANT
BRR ADH 4X3 ABS CNTRL BYND (GAUZE/BANDAGES/DRESSINGS) ×2
CHLORAPREP W/TINT 26ML (MISCELLANEOUS) ×3 IMPLANT
CLAMP CORD UMBIL (MISCELLANEOUS) IMPLANT
CLOSURE STERI-STRIP 1/2X4 (GAUZE/BANDAGES/DRESSINGS) ×1
CLOSURE WOUND 1/2 X4 (GAUZE/BANDAGES/DRESSINGS)
CLOTH BEACON ORANGE TIMEOUT ST (SAFETY) ×3 IMPLANT
CLSR STERI-STRIP ANTIMIC 1/2X4 (GAUZE/BANDAGES/DRESSINGS) ×1 IMPLANT
DRAPE C SECTION CLR SCREEN (DRAPES) ×3 IMPLANT
DRSG OPSITE POSTOP 4X10 (GAUZE/BANDAGES/DRESSINGS) ×3 IMPLANT
ELECT REM PT RETURN 9FT ADLT (ELECTROSURGICAL) ×3
ELECTRODE REM PT RTRN 9FT ADLT (ELECTROSURGICAL) ×1 IMPLANT
EXTRACTOR VACUUM M CUP 4 TUBE (SUCTIONS) IMPLANT
EXTRACTOR VACUUM M CUP 4' TUBE (SUCTIONS)
GLOVE BIOGEL PI IND STRL 7.0 (GLOVE) ×2 IMPLANT
GLOVE BIOGEL PI INDICATOR 7.0 (GLOVE) ×4
GLOVE ECLIPSE 6.5 STRL STRAW (GLOVE) ×3 IMPLANT
GOWN STRL REUS W/TWL LRG LVL3 (GOWN DISPOSABLE) ×6 IMPLANT
KIT ABG SYR 3ML LUER SLIP (SYRINGE) IMPLANT
NDL HYPO 25X5/8 SAFETYGLIDE (NEEDLE) IMPLANT
NEEDLE HYPO 22GX1.5 SAFETY (NEEDLE) ×3 IMPLANT
NEEDLE HYPO 25X5/8 SAFETYGLIDE (NEEDLE) IMPLANT
NS IRRIG 1000ML POUR BTL (IV SOLUTION) ×3 IMPLANT
PACK C SECTION WH (CUSTOM PROCEDURE TRAY) ×3 IMPLANT
PAD OB MATERNITY 4.3X12.25 (PERSONAL CARE ITEMS) ×3 IMPLANT
RTRCTR C-SECT PINK 25CM LRG (MISCELLANEOUS) IMPLANT
STRIP CLOSURE SKIN 1/2X4 (GAUZE/BANDAGES/DRESSINGS) IMPLANT
SUT CHROMIC GUT AB #0 18 (SUTURE) IMPLANT
SUT MNCRL 0 VIOLET CTX 36 (SUTURE) ×3 IMPLANT
SUT MON AB 2-0 SH 27 (SUTURE)
SUT MON AB 2-0 SH27 (SUTURE) IMPLANT
SUT MON AB 3-0 SH 27 (SUTURE)
SUT MON AB 3-0 SH27 (SUTURE) IMPLANT
SUT MON AB 4-0 PS1 27 (SUTURE) IMPLANT
SUT MONOCRYL 0 CTX 36 (SUTURE) ×6
SUT PLAIN 2 0 (SUTURE)
SUT PLAIN 2 0 XLH (SUTURE) IMPLANT
SUT PLAIN ABS 2-0 CT1 27XMFL (SUTURE) IMPLANT
SUT VIC AB 0 CT1 36 (SUTURE) ×6 IMPLANT
SUT VIC AB 2-0 CT1 27 (SUTURE) ×3
SUT VIC AB 2-0 CT1 TAPERPNT 27 (SUTURE) ×1 IMPLANT
SUT VIC AB 4-0 KS 27 (SUTURE) ×4 IMPLANT
SUT VIC AB 4-0 PS2 27 (SUTURE) IMPLANT
SYR CONTROL 10ML LL (SYRINGE) ×3 IMPLANT
TOWEL OR 17X24 6PK STRL BLUE (TOWEL DISPOSABLE) ×3 IMPLANT
TRAY FOLEY W/BAG SLVR 14FR LF (SET/KITS/TRAYS/PACK) IMPLANT

## 2017-10-24 NOTE — Brief Op Note (Signed)
10/24/2017  3:42 PM  PATIENT:  Kristin Gonzalez  32 y.o. female  PRE-OPERATIVE DIAGNOSIS:  Previous Cesarean Section x two; Term gestation  POST-OPERATIVE DIAGNOSIS:  Previous Cesarean Section x two. Term  gestation  PROCEDURE: Repeat Cesarean section, Sharl Ma hysterotomy SURGEON:  Surgeon(s) and Role:    * Maxie Better, MD - Primary  PHYSICIAN ASSISTANT:   ASSISTANTS: Marlinda Mike, CNM   ANESTHESIA:   spinal FINDINGS; live female CAN x 1, nl tubes and ovaries, thin LUS EBL:  538 mL   BLOOD ADMINISTERED:none  DRAINS: none   LOCAL MEDICATIONS USED:  MARCAINE     SPECIMEN:  Source of Specimen:  placenta  DISPOSITION OF SPECIMEN:  N/A  COUNTS:  YES  TOURNIQUET:  * No tourniquets in log *  DICTATION: .Other Dictation: Dictation Number [id number]  PLAN OF CARE: Admit to inpatient   PATIENT DISPOSITION:  PACU - hemodynamically stable.   Delay start of Pharmacological VTE agent (>24hrs) due to surgical blood loss or risk of bleeding: no

## 2017-10-24 NOTE — Transfer of Care (Signed)
Immediate Anesthesia Transfer of Care Note  Patient: Kristin Gonzalez  Procedure(s) Performed: Repeat CESAREAN SECTION (N/A )  Patient Location: PACU  Anesthesia Type:Spinal  Level of Consciousness: awake, alert  and oriented  Airway & Oxygen Therapy: Patient Spontanous Breathing  Post-op Assessment: Report given to RN and Post -op Vital signs reviewed and stable  Post vital signs: Reviewed and stable  Last Vitals:  Vitals Value Taken Time  BP    Temp    Pulse    Resp    SpO2      Last Pain:  Vitals:   10/24/17 1309  TempSrc: Oral      Patients Stated Pain Goal: 4 (10/24/17 1309)  Complications: No apparent anesthesia complications

## 2017-10-24 NOTE — Op Note (Signed)
NAMEMALIAKA, BRASINGTON MEDICAL RECORD NW:[id number] ACCOUNT [id number] DATE OF BIRTH:March 30, 1986 FACILITY: [facility] LOCATION: [id number] PHYSICIAN:Emmanuelle Hibbitts A. Anisa Leanos, MD  OPERATIVE REPORT  DATE OF PROCEDURE:  10/24/2017  PREOPERATIVE DIAGNOSIS:  Previous cesarean sections x2, term gestation.  PROCEDURE:  Repeat cesarean section, Kerr hysterotomy.  POSTOPERATIVE DIAGNOSIS:  Previous cesarean sections x2, term gestation.    ANESTHESIA:  Spinal.  SURGEON:  Maxie Better, MD  ASSISTANT:  Marlinda Mike, CNM  DESCRIPTION OF PROCEDURE:  Under adequate spinal anesthesia, the patient was placed in the supine position with a left lateral tilt.  An indwelling Foley catheter was sterilely placed.  Marcaine 0.25% was injected along the previous Pfannenstiel skin incision site.  A Pfannenstiel skin incision was then made and carried down to the rectus fascia.  The rectus fascia was opened transversely.  The rectus fascia was sharply dissected off the rectus muscle in a superior and inferior fashion.  The rectus  muscle was split in line.  The parietal peritoneum was opened and extended.  A self-retaining Alexis retractor was then placed.  A thin lower uterine segment was noted.  The bladder peritoneum was opened transversely.  The bladder was bluntly dissected off the lower uterine segment and displaced inferiorly.  A curvilinear low transverse incision was then made and extended with bandage scissors.  Incidental rupture of membranes occurred.  Clear amniotic fluid noted.  Subsequent delivery of a live female from  the left occiput transverse position with a nuchal cord x1 was accomplished.  The cord was reduced.  Delayed clamping x1 minute was done.  The cord was clamped and cut.  The baby was transferred to the awaiting pediatricians with Apgars 9 and 9 at one and five minutes.  The placenta was manually removed.  Uterine cavity was cleaned of debris.  The uterine incision had no extensions.  The incision  was closed in 2 layers, the first layer with 0 Monocryl running lock stitch, second layer imbricating using 0 Monocryl suture.  Two figure-of-eight sutures were placed along the incision with small bleeding site.  The abdomen was irrigated and suctioned of debris.  Normal tubes and ovaries were noted bilaterally.  The Alexis retractor was then removed.  The parietal peritoneum was then closed with 2-0 Vicryl.  The rectus fascia was closed with 0 Vicryl x2.  The subcutaneous area was irrigated.  Small bleeders cauterized.  Vicryl 4-0 subcuticular closure was done.  Steri-Strips and benzoin were placed.  SPECIMENS:  Placenta not sent to pathology.  ESTIMATED BLOOD LOSS:  538 mL.  URINE OUTPUT:  200 mL clear yellow urine.  INTRAOPERATIVE FLUIDS:  2200 mL.  COUNTS:  Sponge and instrument counts x2 were correct.  COMPLICATIONS:  None.  The patient tolerated the procedure well and was transferred to Recovery Room in stable condition.  LN/NUANCE  D:10/24/2017 T:10/24/2017 JOB:[id number]

## 2017-10-24 NOTE — Anesthesia Procedure Notes (Addendum)
Spinal  Patient location during procedure: OR Start time: 10/24/2017 2:45 PM End time: 10/24/2017 2:49 PM Staffing Anesthesiologist: Marcene Duos, MD Performed: anesthesiologist  Preanesthetic Checklist Completed: patient identified, site marked, surgical consent, pre-op evaluation, timeout performed, IV checked, risks and benefits discussed and monitors and equipment checked Spinal Block Patient position: sitting Prep: site prepped and draped and DuraPrep Patient monitoring: blood pressure, continuous pulse ox and heart rate Approach: midline Location: L3-4 Injection technique: single-shot Needle Needle type: Pencan  Needle gauge: 24 G Needle length: 9 cm Assessment Sensory level: T6 Additional Notes Aspiration before during and after injection LA. Successful block and level to pinprick.

## 2017-10-24 NOTE — Anesthesia Procedure Notes (Addendum)
Spinal  Start time: 10/24/2017 2:25 PM End time: 10/24/2017 2:32 PM Staffing Anesthesiologist: Marcene Duos, MD Performed: anesthesiologist  Preanesthetic Checklist Completed: patient identified, site marked, surgical consent, pre-op evaluation, timeout performed, IV checked, risks and benefits discussed and monitors and equipment checked Spinal Block Patient position: sitting Prep: site prepped and draped and DuraPrep Patient monitoring: blood pressure, continuous pulse ox and heart rate Approach: midline Location: L4-5 Injection technique: single-shot Needle Needle type: Pencan  Needle gauge: 24 G Needle length: 9 cm Assessment Events: paresthesia and failed spinal Additional Notes CSF free flowing. Unable to aspirate initially. Needle rotated 90degrees x4. CSF still free flowing. LA attached at hub. Aspiration before injection but not after. After 10 min pt with no discernable sensory loss. See repeat note for successful SAB.

## 2017-10-24 NOTE — Anesthesia Preprocedure Evaluation (Signed)
Anesthesia Evaluation  Patient identified by MRN, date of birth, ID band Patient awake    Reviewed: Allergy & Precautions, NPO status , Patient's Chart, lab work & pertinent test results  History of Anesthesia Complications Negative for: history of anesthetic complications  Airway Mallampati: II  TM Distance: >3 FB Neck ROM: Full    Dental  (+) Teeth Intact, Dental Advisory Given   Pulmonary former smoker,    Pulmonary exam normal breath sounds clear to auscultation       Cardiovascular Exercise Tolerance: Good (-) hypertensionnegative cardio ROS Normal cardiovascular exam Rhythm:Regular Rate:Normal     Neuro/Psych negative neurological ROS  negative psych ROS   GI/Hepatic negative GI ROS, Neg liver ROS,   Endo/Other  negative endocrine ROS  Renal/GU negative Renal ROS     Musculoskeletal negative musculoskeletal ROS (+)   Abdominal   Peds  Hematology negative hematology ROS (+)   Anesthesia Other Findings Day of surgery medications reviewed with the patient.  Reproductive/Obstetrics (+) Pregnancy                             Lab Results  Component Value Date   WBC 12.8 (H) 10/23/2017   HGB 12.3 10/23/2017   HCT 36.3 10/23/2017   MCV 86.0 10/23/2017   PLT 215 10/23/2017   Lab Results  Component Value Date   CREATININE 0.7 09/22/2010   BUN 5 (L) 09/22/2010   NA 137 09/22/2010   K 3.6 09/22/2010   CL 101 09/22/2010   CO2 28 02/04/2010    Anesthesia Physical  Anesthesia Plan  ASA: II  Anesthesia Plan: Spinal   Post-op Pain Management:    Induction: Intravenous  PONV Risk Score and Plan: 3 and Ondansetron, Dexamethasone, Treatment may vary due to age or medical condition and Scopolamine patch - Pre-op  Airway Management Planned: Nasal Cannula  Additional Equipment: None  Intra-op Plan:   Post-operative Plan:   Informed Consent: I have reviewed the patients  History and Physical, chart, labs and discussed the procedure including the risks, benefits and alternatives for the proposed anesthesia with the patient or authorized representative who has indicated his/her understanding and acceptance.   Dental advisory given  Plan Discussed with: CRNA and Surgeon  Anesthesia Plan Comments:         Anesthesia Quick Evaluation

## 2017-10-25 LAB — CBC
HCT: 32.8 % — ABNORMAL LOW (ref 36.0–46.0)
Hemoglobin: 11 g/dL — ABNORMAL LOW (ref 12.0–15.0)
MCH: 29.6 pg (ref 26.0–34.0)
MCHC: 33.5 g/dL (ref 30.0–36.0)
MCV: 88.2 fL (ref 78.0–100.0)
PLATELETS: 169 10*3/uL (ref 150–400)
RBC: 3.72 MIL/uL — AB (ref 3.87–5.11)
RDW: 13.1 % (ref 11.5–15.5)
WBC: 16.4 10*3/uL — AB (ref 4.0–10.5)

## 2017-10-25 MED ORDER — MAGNESIUM OXIDE 400 (241.3 MG) MG PO TABS
400.0000 mg | ORAL_TABLET | Freq: Every day | ORAL | Status: DC
  Administered 2017-10-25 – 2017-10-26 (×2): 400 mg via ORAL
  Filled 2017-10-25 (×3): qty 1

## 2017-10-25 MED ORDER — HYDROXYZINE HCL 10 MG PO TABS
10.0000 mg | ORAL_TABLET | Freq: Three times a day (TID) | ORAL | Status: AC | PRN
  Filled 2017-10-25: qty 1

## 2017-10-25 MED ORDER — NALBUPHINE HCL 10 MG/ML IJ SOLN
5.0000 mg | Freq: Once | INTRAMUSCULAR | Status: AC
  Administered 2017-10-25: 5 mg via INTRAVENOUS
  Filled 2017-10-25: qty 1

## 2017-10-25 NOTE — Progress Notes (Signed)
POSTOPERATIVE DAY # 1 S/P R-CS  S:        Reports feeling intense itching - bendryl not effective & would like something else             Tolerating po intake / no nausea / no vomiting / no flatus / no BM              Reports + gas pains this am R-shoulder             Bleeding is light             Pain controlled with Motrin and Tylenol with long-acting narcotic             Up ad lib / ambulatory/ voiding QS  Newborn Breast   O:  VS: BP 94/63 (BP Location: Right Arm)   Pulse 66   Temp (!) 97.4 F (36.3 C)   Resp 18   Ht 5' (1.524 m)   Wt 63.2 kg (139 lb 4.8 oz)   SpO2 100%   Breastfeeding? Unknown   BMI 27.21 kg/m   LABS:              Recent Labs    10/23/17 1135 10/25/17 0539  WBC 12.8* 16.4*  HGB 12.3 11.0*  PLT 215 169               Bloodtype: --/--/O POS (05/21 1135)  Rubella: Immune (11/02 0000)                                           I&O: Intake/Output      05/22 0701 - 05/23 0700 05/23 0701 - 05/24 0700   P.O. 900    I.V. (mL/kg) 2525 (40)    Total Intake(mL/kg) 3425 (54.2)    Urine (mL/kg/hr) 1775    Blood 538    Total Output 2313    Net +1112                    Physical Exam:             Alert and Oriented X3  Lungs: Clear and unlabored  Heart: regular rate and rhythm / no mumurs  Abdomen: soft, non-tender, non-distended              Fundus: firm, non-tender, Ueven             Dressing honeycomb intact with 2/3 saturated with marked bloody drainage             Incision:  approximated with suture / no erythema / no ecchymosis / no active drainage  Perineum: intact  Lochia: light  Extremities: no edema, no calf pain or tenderness, SCDs  A:        POD # 1 S/P R-CS             refractory pruritus - residual effects from anesthesia (double spinal does in OR)             gas pain             P:        routine postoperative care              advance activity as tolerated             change dressing & monitor for further bleeding at incision site  shower early today to reduce gas and itching             Nubain this am for itching with Atarax ordered PRN next 24 hours             increase water intake - warm fluids - soups and teas to increase bowel motility                    magnesium to help bowel movement   Marlinda Mike CNM, MSN, [facility] 10/25/2017, 7:09 AM

## 2017-10-25 NOTE — Anesthesia Postprocedure Evaluation (Signed)
Anesthesia Post Note  Patient: Jeena Schreiter  Procedure(s) Performed: Repeat CESAREAN SECTION (N/A )     Patient location during evaluation: Mother Baby Anesthesia Type: Spinal Level of consciousness: awake and alert Pain management: pain level controlled Vital Signs Assessment: post-procedure vital signs reviewed and stable Respiratory status: spontaneous breathing Cardiovascular status: blood pressure returned to baseline Postop Assessment: no headache, patient able to bend at knees, no backache, no apparent nausea or vomiting, epidural receding, adequate PO intake and able to ambulate Anesthetic complications: no    Last Vitals:  Vitals:   10/25/17 0100 10/25/17 0600  BP: (!) 100/57 94/63  Pulse: (!) 53 66  Resp: 18 18  Temp: 36.8 C (!) 36.3 C  SpO2:  100%    Last Pain:  Vitals:   10/25/17 0700  TempSrc:   PainSc: 1    Pain Goal: Patients Stated Pain Goal: 2 (10/25/17 0600)               Salome Arnt

## 2017-10-25 NOTE — Lactation Note (Addendum)
This note was copied from a baby's chart. Lactation Consultation Note  Patient Name: Kristin Gonzalez Today's Date: 10/25/2017 Reason for consult: Infant < 6lbs   Baby 22 hours old.  < 6 lbs.  Ex BF. Observed 10 min feeding. Sucks and swallows observed. Mother states baby has been too sleepy to spoon feed. Mother hand expressed approx 3 ml from her R breast. Taught mother how to finger syringe feed and baby did well. Then baby returned to the other breast.   Recommend mother post pump 4-6 times per day for 10-20 min with DEBP on initiation setting. Give baby back volume pumped at the next feeding. Mother's L nipple is pink and tender.  Encouraged applying ebm for soreness.      Maternal Data    Feeding Feeding Type: Breast Fed Length of feed: 10 min  LATCH Score Latch: Grasps breast easily, tongue down, lips flanged, rhythmical sucking.  Audible Swallowing: A few with stimulation  Type of Nipple: Everted at rest and after stimulation  Comfort (Breast/Nipple): Filling, red/small blisters or bruises, mild/mod discomfort  Hold (Positioning): Assistance needed to correctly position infant at breast and maintain latch.  LATCH Score: 7  Interventions Interventions: Breast feeding basics reviewed;Assisted with latch;Breast compression;DEBP;Hand express  Lactation Tools Discussed/Used     Consult Status Consult Status: Follow-up Date: 10/26/17 Follow-up type: In-patient    Dahlia Byes [facility] 10/25/2017, 1:15 PM

## 2017-10-25 NOTE — Lactation Note (Addendum)
This note was copied from a baby's chart. Lactation Consultation Note Baby 10 hrs old BF well. Mom states having no difficulty. Mom BF her 1st child now 30 yrs old for 1 yr. BF her 2nd child now 2 1/2 yrs old for 24 months.  Mom has everted nipples. Expressed colostrum.  Mom stated that she always favored the Rt. Breast in BF her children and pumped the Lt. Mom stated she had less milk supply in the Lt. Verses Rt. Mom wants to start out BF to the Lt. Side equally as the Rt. Breast. Both has everted nipples.  Mom placed baby in football position, pulling and stretching nipple to the baby. Encouraged mom to bring baby to her. Another pillow added under baby to elevate closer, support under mom's hand. Encouraged cheeks to breast. Baby obtaining deep latch, occasionally not having wide flange. Baby is popping off and on before finished feeding, rooting wanting more.  Hand expression demonstrated collect drops of colostrum in colostrum container, left spoon for mom to give colostrum to baby.  Discussed w/mom baby's weight will drop below 6 lbs LC wanted mom to consider giving the baby colostrum after BF. Encouraged mom to hand express after BF, and using DEBP for extra stimulation and supplementation if colostrum pumped. Milk storage, options of feeding, STS, I&O, cluster feeding, supply and demand discussed. Mom states has understanding of pumping and supplementing, would like to sleep  Some before pumping. LC set up DEBP, Mom shown how to use DEBP & how to disassemble, clean, & reassemble parts.Mom knows to pump q3h for 15-20 min. 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   Mom encouraged to feed baby 8-12 times/24 hours and with feeding cues.  Encouraged to call for assistance or questions. [facility] brochure given w/resources, support groups and LC services.  Patient Name: Girl Taleah Bluestein Today's Date: 10/25/2017 Reason for consult: Initial assessment   Maternal Data Has patient been  taught Hand Expression?: Yes Does the patient have breastfeeding experience prior to this delivery?: Yes  Feeding Feeding Type: Breast Fed Length of feed: 25 min  LATCH Score Latch: Repeated attempts needed to sustain latch, nipple held in mouth throughout feeding, stimulation needed to elicit sucking reflex.  Audible Swallowing: A few with stimulation  Type of Nipple: Everted at rest and after stimulation  Comfort (Breast/Nipple): Soft / non-tender  Hold (Positioning): Assistance needed to correctly position infant at breast and maintain latch.  LATCH Score: 7  Interventions Interventions: Breast feeding basics reviewed;Adjust position;Assisted with latch;Support pillows;Skin to skin;Position options;Breast massage;Expressed milk;Hand express;Coconut oil;Breast compression  Lactation Tools Discussed/Used     Consult Status Consult Status: Follow-up Date: 10/26/17 Follow-up type: In-patient    Croy Drumwright, Diamond Nickel 10/25/2017, 1:20 AM

## 2017-10-26 LAB — BIRTH TISSUE RECOVERY COLLECTION (PLACENTA DONATION)

## 2017-10-26 MED ORDER — OXYCODONE-ACETAMINOPHEN 5-325 MG PO TABS: 1.0000 | ORAL_TABLET | Freq: Four times a day (QID) | ORAL | 0 refills | Status: DC | PRN

## 2017-10-26 MED ORDER — IBUPROFEN 800 MG PO TABS: 800.0000 mg | ORAL_TABLET | Freq: Three times a day (TID) | ORAL | 0 refills | Status: DC | PRN

## 2017-10-26 MED ORDER — ACETAMINOPHEN 500 MG PO TABS
1000.0000 mg | ORAL_TABLET | Freq: Four times a day (QID) | ORAL | Status: DC | PRN
Start: 2017-10-26 — End: 2017-10-26
  Administered 2017-10-26: 1000 mg via ORAL
  Filled 2017-10-26: qty 2

## 2017-10-26 NOTE — Lactation Note (Signed)
This note was copied from a baby's chart. Lactation Consultation Note  Patient Name: Girl Crescentia Boutwell JYNWG'N Date: 10/26/2017 Reason for consult: Follow-up assessment;Infant < 6lbs Mom c/o latch on pain on left side.  Small crack noted at nipple base.  Assisted with correct positioning for a wider latch.  Recommended varying position.  Mom is post pumping and syringe feeding baby colostrum.  Assisted with finger feeding.  She is using colostrum and coconut oil for soreness.  Lactation outpatient services and support reviewed and encouraged prn.  Maternal Data    Feeding Feeding Type: Breast Fed Length of feed: 25 min  LATCH Score Latch: Grasps breast easily, tongue down, lips flanged, rhythmical sucking.  Audible Swallowing: Spontaneous and intermittent  Type of Nipple: Everted at rest and after stimulation  Comfort (Breast/Nipple): Filling, red/small blisters or bruises, mild/mod discomfort  Hold (Positioning): No assistance needed to correctly position infant at breast.  LATCH Score: 9  Interventions    Lactation Tools Discussed/Used     Consult Status Consult Status: Complete Follow-up type: Call as needed    Huston Foley 10/26/2017, 12:15 PM

## 2017-10-26 NOTE — Discharge Summary (Signed)
Obstetric Discharge Summary Reason for Admission: cesarean section Prenatal Procedures: none Intrapartum Procedures: cesarean: low cervical, transverse Postpartum Procedures: none Complications-Operative and Postpartum: none Hemoglobin  Date Value Ref Range Status  10/25/2017 11.0 (L) 12.0 - 15.0 g/dL Final   HCT  Date Value Ref Range Status  10/25/2017 32.8 (L) 36.0 - 46.0 % Final    Physical Exam:  General: alert, cooperative and no distress Lochia: appropriate Uterine Fundus: firm Incision: healing well DVT Evaluation: No evidence of DVT seen on physical exam.  Discharge Diagnoses: Term Pregnancy-delivered  Discharge Information: Date: 10/26/2017 Activity: pelvic rest and lifting restrictions Diet: routine Medications: Ibuprofen and Percocet Condition: stable Instructions: refer to practice specific booklet Discharge to: home Follow-up Information    Maxie Better, MD Follow up.   Specialty:  Obstetrics and Gynecology Contact information: [address] [place] [address] [phone]           Newborn Data: Live born female  Birth Weight: 6 lb 2.1 oz (2780 g) APGAR: 9, 9  Newborn Delivery   Birth date/time:  10/24/2017 15:09:00 Delivery type:  C-Section, Low Transverse Trial of labor:  No C-section categorization:  Repeat     Home with mother.  Vick Frees 10/26/2017, 11:17 AM

## 2017-10-26 NOTE — Progress Notes (Signed)
Pain controlled though does note pain with cough, sneeze, etc; taking tylenol/ibuprofen; may consider narcotic for severe pain and she is open to this Passing flatus, ambulating; voiding w/o difficulty  Temp:  [98 F (36.7 C)-98.8 F (37.1 C)] 98 F (36.7 C) (05/24 0615) Pulse Rate:  [60-72] 72 (05/24 0615) Resp:  [14-18] 14 (05/24 0615) BP: (93-105)/(55-62) 105/61 (05/24 0615) SpO2:  [99 %] 99 % (05/24 0615)  A&ox3 rrr ctab Abd: soft, nt, nd; dressing c/d/i LE: trace edema, nt bilat   CBC Latest Ref Rng & Units 10/25/2017 10/23/2017 02/27/2015  WBC 4.0 - 10.5 K/uL 16.4(H) 12.8(H) 20.6(H)  Hemoglobin 12.0 - 15.0 g/dL 11.0(L) 12.3 10.8(L)  Hematocrit 36.0 - 46.0 % 32.8(L) 36.3 31.5(L)  Platelets 150 - 400 K/uL 169 215 167   A/P; pod 2 s/p rltcs 1. Doing well, ok for d/c home today, plan f/u in office in 6 wks, office to call pt in 1 wk 2. Rh pos 3. Rubella immune

## 2017-11-07 MED FILL — NEXPLANON 68 MG IMPLANT: 68 | 90 days supply | Qty: 1 | Fill #0

## 2017-11-09 DIAGNOSIS — Z5189 Encounter for other specified aftercare: Secondary | ICD-10-CM | POA: Diagnosis not present

## 2018-02-22 DIAGNOSIS — H52223 Regular astigmatism, bilateral: Secondary | ICD-10-CM | POA: Diagnosis not present

## 2018-02-22 DIAGNOSIS — H5213 Myopia, bilateral: Secondary | ICD-10-CM | POA: Diagnosis not present

## 2018-02-28 DIAGNOSIS — F53 Postpartum depression: Secondary | ICD-10-CM | POA: Diagnosis not present

## 2018-02-28 DIAGNOSIS — F39 Unspecified mood [affective] disorder: Secondary | ICD-10-CM | POA: Diagnosis not present

## 2018-04-24 DIAGNOSIS — R4589 Other symptoms and signs involving emotional state: Secondary | ICD-10-CM | POA: Diagnosis not present

## 2018-04-24 DIAGNOSIS — N952 Postmenopausal atrophic vaginitis: Secondary | ICD-10-CM | POA: Diagnosis not present

## 2018-07-29 DIAGNOSIS — R5383 Other fatigue: Secondary | ICD-10-CM | POA: Diagnosis not present

## 2018-07-29 DIAGNOSIS — Z0000 Encounter for general adult medical examination without abnormal findings: Secondary | ICD-10-CM | POA: Diagnosis not present

## 2018-07-29 DIAGNOSIS — Z114 Encounter for screening for human immunodeficiency virus [HIV]: Secondary | ICD-10-CM | POA: Diagnosis not present

## 2019-01-08 DIAGNOSIS — K219 Gastro-esophageal reflux disease without esophagitis: Secondary | ICD-10-CM | POA: Diagnosis not present

## 2019-01-08 DIAGNOSIS — Z6822 Body mass index (BMI) 22.0-22.9, adult: Secondary | ICD-10-CM | POA: Diagnosis not present

## 2019-01-08 DIAGNOSIS — Z309 Encounter for contraceptive management, unspecified: Secondary | ICD-10-CM | POA: Diagnosis not present

## 2019-01-08 DIAGNOSIS — Z01419 Encounter for gynecological examination (general) (routine) without abnormal findings: Secondary | ICD-10-CM | POA: Diagnosis not present

## 2019-01-08 DIAGNOSIS — R110 Nausea: Secondary | ICD-10-CM | POA: Diagnosis not present

## 2019-01-08 DIAGNOSIS — M549 Dorsalgia, unspecified: Secondary | ICD-10-CM | POA: Diagnosis not present

## 2019-01-08 LAB — HM PAP SMEAR: HM Pap smear: NEGATIVE

## 2019-01-08 MED FILL — LANSOPRAZOLE DR 15 MG CAP: 15 | 30 days supply | Qty: 30 | Fill #0

## 2019-02-28 DIAGNOSIS — Z79899 Other long term (current) drug therapy: Secondary | ICD-10-CM | POA: Diagnosis not present

## 2019-02-28 DIAGNOSIS — Z1159 Encounter for screening for other viral diseases: Secondary | ICD-10-CM | POA: Diagnosis not present

## 2019-02-28 DIAGNOSIS — R300 Dysuria: Secondary | ICD-10-CM | POA: Diagnosis not present

## 2019-02-28 DIAGNOSIS — E7800 Pure hypercholesterolemia, unspecified: Secondary | ICD-10-CM | POA: Diagnosis not present

## 2019-02-28 DIAGNOSIS — E559 Vitamin D deficiency, unspecified: Secondary | ICD-10-CM | POA: Diagnosis not present

## 2019-02-28 DIAGNOSIS — Z0000 Encounter for general adult medical examination without abnormal findings: Secondary | ICD-10-CM | POA: Diagnosis not present

## 2019-03-10 DIAGNOSIS — R109 Unspecified abdominal pain: Secondary | ICD-10-CM | POA: Diagnosis not present

## 2019-03-10 DIAGNOSIS — R1084 Generalized abdominal pain: Secondary | ICD-10-CM | POA: Diagnosis not present

## 2019-03-10 DIAGNOSIS — Z975 Presence of (intrauterine) contraceptive device: Secondary | ICD-10-CM | POA: Diagnosis not present

## 2019-03-10 DIAGNOSIS — Z3200 Encounter for pregnancy test, result unknown: Secondary | ICD-10-CM | POA: Diagnosis not present

## 2019-03-10 DIAGNOSIS — F17210 Nicotine dependence, cigarettes, uncomplicated: Secondary | ICD-10-CM | POA: Diagnosis not present

## 2019-03-27 ENCOUNTER — Other Ambulatory Visit: Payer: Self-pay

## 2019-03-27 ENCOUNTER — Ambulatory Visit: Payer: 59 | Attending: Internal Medicine | Admitting: Physical Therapy

## 2019-03-27 DIAGNOSIS — M545 Low back pain, unspecified: Secondary | ICD-10-CM

## 2019-03-27 DIAGNOSIS — M533 Sacrococcygeal disorders, not elsewhere classified: Secondary | ICD-10-CM | POA: Diagnosis not present

## 2019-03-27 DIAGNOSIS — M6281 Muscle weakness (generalized): Secondary | ICD-10-CM | POA: Insufficient documentation

## 2019-03-27 DIAGNOSIS — G8929 Other chronic pain: Secondary | ICD-10-CM | POA: Diagnosis not present

## 2019-03-27 NOTE — Patient Instructions (Signed)
    Home exercise program created by JoAnne Kreis, PT.  For questions, please contact JoAnne via phone at [phone] or email at [email or web address]  [facility] [place] [address]  [address] [place], [place], [address] Phone: [phone]   Fax:  [phone]    

## 2019-03-27 NOTE — Therapy (Signed)
[facility] [place] [address]  [place], [place], [address] Phone: [phone]   Fax:  [phone]  Physical Therapy Evaluation  Patient Details  Name: Kristin Gonzalez MRN: [id number] Date of Birth: [birth year] Referring Provider (PT): Charna Archer, MD   Encounter Date: 03/27/2019  PT End of Session - 03/27/19 1617    Visit Number  1    Number of Visits  12    Authorization Type  [facility]    PT Start Time  8366    PT Stop Time  1720    PT Time Calculation (min)  63 min    Activity Tolerance  Patient tolerated treatment well    Behavior During Therapy  [facility] for tasks assessed/performed       Past Medical History:  Diagnosis Date  . Medical history non-contributory   . Postpartum care following cesarean delivery (8/28) 01/30/2013  . Postpartum care following cesarean delivery (9/23) 02/26/2015  . Postpartum care following cesarean delivery (9/23) 02/26/2015  . Vitamin D deficiency     Past Surgical History:  Procedure Laterality Date  . CESAREAN SECTION N/A 01/30/2013   Procedure: CESAREAN SECTION;  Surgeon: Elveria Royals, MD;  Location: [facility];  Service: Obstetrics;  Laterality: N/A;  . CESAREAN SECTION N/A 02/26/2015   Procedure: Repeat CESAREAN SECTION;  Surgeon: Servando Salina, MD;  Location: [facility];  Service: Obstetrics;  Laterality: N/A;  . CESAREAN SECTION N/A 10/24/2017   Procedure: Repeat CESAREAN SECTION;  Surgeon: Servando Salina, MD;  Location: [facility];  Service: Obstetrics;  Laterality: N/A;  EDD: 10/30/17    There were no vitals filed for this visit.   Subjective Assessment - 03/27/19 1622    Subjective  Pt reports 3 C-sections in <6 yrs - most recent delivery 16 months ago. Excruciating LBP during last pregnancy. Has 2 spinals during delivery. Pain never went away but worsening over past few weeks to month. Notes occasional tingling sensation in L arm & leg.    Limitations  Standing;House  hold activities    How long can you stand comfortably?  "a couple minutes"    Patient Stated Goals  "strengthening to help with pain"    Currently in Pain?  Yes    Pain Score  4    3-4/10, up to 9/10 at worst   Pain Location  Sacrum    Pain Orientation  Mid    Pain Descriptors / Indicators  Pressure;Dull    Pain Type  Chronic pain    Pain Radiating Towards  n/a    Pain Onset  More than a month ago    Pain Frequency  Constant    Aggravating Factors   picking up her children; changing positions    Pain Relieving Factors  SI belt (worn for work or severe pain days); husband applying sacral P/A pressure    Effect of Pain on Daily Activities  works through pain         [facility] PT Assessment - 03/27/19 1617      Assessment   Medical Diagnosis  Low back/sacral pain    Referring Provider (PT)  Charna Archer, MD    Onset Date/Surgical Date  --   chronic   Next MD Visit  none scheduled    Prior Therapy  none      Precautions   Precautions  None      Balance Screen   Has the patient fallen in the past 6 months  No  Has the patient had a decrease in activity level because of a fear of falling?   No    Is the patient reluctant to leave their home because of a fear of falling?   No      Home Environment   Living Environment  Private residence    Living Arrangements  Spouse/significant other;Children    Type of [place] to enter    Entrance Stairs-Number of Steps  [place]  Two level;Bed/bath upstairs      Prior Function   Level of Independence  Independent    Vocation  Full time employment    Vocation Requirements  [employer] - weekend shifts    Leisure  keeping up with 3 young kids      Cognition   Overall Cognitive Status  Within Functional Limits for tasks assessed      Observation/Other Assessments   Focus on Therapeutic Outcomes (FOTO)   Lumbar - 58% (42% limitation); Predicted 70% (30% limitation)      ROM / Strength   AROM /  PROM / Strength  AROM;Strength      AROM   Overall AROM   Within functional limits for tasks performed;Deficits;Due to pain    Overall AROM Comments  mild pain reported moving into flexion and with extension beyond neutral     AROM Assessment Site  Lumbar      Strength   Strength Assessment Site  Hip;Knee    Right/Left Hip  Right;Left    Right Hip Flexion  4/5    Right Hip Extension  4/5    Right Hip External Rotation   4-/5    Right Hip Internal Rotation  4+/5    Right Hip ABduction  4-/5    Right Hip ADduction  4-/5    Left Hip Flexion  4/5    Left Hip Extension  4/5    Left Hip External Rotation  4-/5    Left Hip Internal Rotation  4+/5    Left Hip ABduction  4-/5    Left Hip ADduction  4-/5    Right/Left Knee  Right;Left    Right Knee Flexion  5/5    Right Knee Extension  5/5    Left Knee Flexion  5/5    Left Knee Extension  5/5      Flexibility   Soft Tissue Assessment /Muscle Length  yes    Hamstrings  mild tight B    Quadriceps  WNL    ITB  WNL    Piriformis  very mild tight B      Palpation   SI assessment   apparent L innominate upslip with anterior rotation    Palpation comment  ttp over B SIJ/sacrum with mild increased muscle tension and mild ttp in lateral glutes and piriformis                Objective measurements completed on examination: See above findings.      [facility] Adult PT Treatment/Exercise - 03/27/19 1617      Exercises   Exercises  Lumbar      Lumbar Exercises: Stretches   Passive Hamstring Stretch  Right;Left;30 seconds;1 rep    Passive Hamstring Stretch Limitations  supine with strap - cues to avoid "bouncing"      Lumbar Exercises: Supine   Pelvic Tilt  10 reps;5 seconds    Pelvic Tilt Limitations  Kegel + posterior  pelvic tilt    Clam  10 reps;3 seconds    Clam Limitations  hooklying alt hip ABD/ER wit red TB    Bridge with clamshell  10 reps;5 seconds    Bridge with [employer] Limitations  + hip ABD isometric with red  TB      Manual Therapy   Manual Therapy  Muscle Energy Technique    Muscle Energy Technique  MET to correct apparent L innominate upslip with anterior rotation followed by pelvic shotgun - no cavitation felt/heard but alignment normalized following MET and pt reporting decreased pain with bridging and transitional movements             PT Education - 03/27/19 1720    Education Details  PT eval findings, anticipated POC and initial POC    Person(s) Educated  Patient    Methods  Explanation;Demonstration;Handout    Comprehension  Verbalized understanding;Returned demonstration;Need further instruction       PT Short Term Goals - 03/27/19 1720      PT SHORT TERM GOAL #1   Title  Patient will be independent with initial HEP    Status  New    Target Date  04/17/19      PT SHORT TERM GOAL #2   Title  Patient will verbalize/demonstrate understanding of neutral spine posture and proper body mechanics to reduce strain on lumbar spine and SIJs    Status  New    Target Date  04/17/19        PT Long Term Goals - 03/27/19 1720      PT LONG TERM GOAL #1   Title  Patient will be independent with ongoing/advanced HEP    Status  New    Target Date  05/08/19      PT LONG TERM GOAL #2   Title  Patient to demonstrate appropriate posture and body mechanics needed for daily activities    Status  New    Target Date  05/08/19      PT LONG TERM GOAL #3   Title  Patient will maintain neutral SIJ alignment w/o need for correction for >/= 2 weeks    Status  New    Target Date  05/08/19      PT LONG TERM GOAL #4   Title  Patient to report pain reduction in frequency and intensity by >/= 50%    Status  New    Target Date  05/08/19      PT LONG TERM GOAL #5   Title  Patient to report ability to perform ADLs, household, child-care and work-related tasks without increased lumbosacral pain    Status  New    Target Date  05/08/19             Plan - 03/27/19 1720    Clinical  Impression Statement  Kristin Gonzalez is a 34 y/o female who presents to OP PT with chronic LBP localized predominantly over sacrum. She reports onset of LBP during her pregnancies which seemed to resolve after childbirth for her first 2 pregnancies but was excruciating during her 3rd/last pregnancy and has lingered since this pregnancy and delivery 16 months ago. All 3 deliveries via C-section. Pain localized to sacrum and B SIJ, with mild tttp and increased muscle tension/tightness noted in B glutes and piriformis. LBP currently makes transitional movements such as sit <> stand difficult, as well as limits her ability to pick up her kids and keep up with household cleaning and chores such as laundry.  Lumbar ROM essentially WFL other than c/o pain into flexion (no pain upon return to standing) and extension beyond neutral. Mild proximal LE tightness present in hamstrings and to a lesser degree in piriformis with mild to moderate proximal LE weakness also noted. SIJ assessment reveal apparent slight L upslip with anterior rotation of pelvis on sacrum which was reducible with MET and resulted in decreased pain with transitional movements. Carrieann will benefit from skilled PT to address above deficits to restore normal SIJ alignment and muscle tension and reduce or eliminate LBP.    Personal Factors and Comorbidities  Comorbidity 2;Time since onset of injury/illness/exacerbation    Comorbidities  s/p C-section x 3; vitamin-D deficiency    Examination-Activity Limitations  Bend;Caring for Others;Carry;Lift;Squat;Transfers    Examination-Participation Restrictions  Interpersonal Relationship;Laundry    Stability/Clinical Decision Making  Stable/Uncomplicated    Clinical Decision Making  Low    Rehab Potential  Good    PT Frequency  2x / week    PT Duration  6 weeks    PT Treatment/Interventions  ADLs/Self Care Home Management;Cryotherapy;Electrical Stimulation;Iontophoresis 64m/ml Dexamethasone;Moist  Heat;Traction;Ultrasound;Functional mobility training;Therapeutic activities;Therapeutic exercise;Neuromuscular re-education;Patient/family education;Manual techniques;Passive range of motion;Dry needling;Taping;Spinal Manipulations;Joint Manipulations    PT Next Visit Plan  Reassess SIJ alignment; Review initial HEP; Posture & body mechanics education; Lumbopelvic strengthening; Manual therapy as indicated; Modalities including trial of estim as indicated for pain    PT Home Exercise Plan  03/27/19 - HS stretch, pelvic tilt, bridge + red TB hip ABD isometric, hooklying alt clams with red TB    Consulted and Agree with Plan of Care  Patient       Patient will benefit from skilled therapeutic intervention in order to improve the following deficits and impairments:  Abnormal gait, Decreased activity tolerance, Decreased endurance, Decreased knowledge of precautions, Decreased mobility, Decreased strength, Hypermobility, Hypomobility, Increased muscle spasms, Impaired perceived functional ability, Impaired flexibility, Improper body mechanics, Postural dysfunction, Pain  Visit Diagnosis: Sacrococcygeal disorders, not elsewhere classified  Chronic midline low back pain without sciatica  Muscle weakness (generalized)     Problem List Patient Active Problem List   Diagnosis Date Noted  . Failed trial of labor following previous cesarean, antepartum 02/28/2015  . Postpartum care following cesarean delivery (9/23) 02/26/2015    Kristin Gonzalez PT, MPT 03/27/2019, 8:02 PM  [facility] [place] [address]Phone: [phone]  Fax:  [phone] Name: INoura PurpuraMRN: [id number] of Birth: 107/04/1986

## 2019-04-01 ENCOUNTER — Ambulatory Visit: Payer: 59 | Admitting: Physical Therapy

## 2019-04-01 ENCOUNTER — Encounter: Payer: Self-pay | Admitting: Physical Therapy

## 2019-04-01 ENCOUNTER — Other Ambulatory Visit: Payer: Self-pay

## 2019-04-01 DIAGNOSIS — M533 Sacrococcygeal disorders, not elsewhere classified: Secondary | ICD-10-CM

## 2019-04-01 DIAGNOSIS — M6281 Muscle weakness (generalized): Secondary | ICD-10-CM | POA: Diagnosis not present

## 2019-04-01 DIAGNOSIS — M545 Low back pain, unspecified: Secondary | ICD-10-CM

## 2019-04-01 DIAGNOSIS — G8929 Other chronic pain: Secondary | ICD-10-CM | POA: Diagnosis not present

## 2019-04-01 NOTE — Patient Instructions (Signed)

## 2019-04-01 NOTE — Therapy (Signed)
[facility] [address]  [address] [place], [place], [address] Phone: [phone]   Fax:  [phone]  Physical Therapy Treatment  Patient Details  Name: Kristin Gonzalez MRN: [id number] Date of Birth: [birth year] Referring Provider (PT): Barney Drain, MD   Encounter Date: 04/01/2019  PT End of Session - 04/01/19 1620    Visit Number  2    Number of Visits  12    Authorization Type  [facility]    PT Start Time  1620    PT Stop Time  1730    PT Time Calculation (min)  70 min    Activity Tolerance  Patient tolerated treatment well    Behavior During Therapy  [facility] for tasks assessed/performed       Past Medical History:  Diagnosis Date  . Medical history non-contributory   . Postpartum care following cesarean delivery (8/28) 01/30/2013  . Postpartum care following cesarean delivery (9/23) 02/26/2015  . Postpartum care following cesarean delivery (9/23) 02/26/2015  . Vitamin D deficiency     Past Surgical History:  Procedure Laterality Date  . CESAREAN SECTION N/A 01/30/2013   Procedure: CESAREAN SECTION;  Surgeon: Robley Fries, MD;  Location: [facility];  Service: Obstetrics;  Laterality: N/A;  . CESAREAN SECTION N/A 02/26/2015   Procedure: Repeat CESAREAN SECTION;  Surgeon: Maxie Better, MD;  Location: [facility];  Service: Obstetrics;  Laterality: N/A;  . CESAREAN SECTION N/A 10/24/2017   Procedure: Repeat CESAREAN SECTION;  Surgeon: Maxie Better, MD;  Location: [facility];  Service: Obstetrics;  Laterality: N/A;  EDD: 10/30/17    There were no vitals filed for this visit.  Subjective Assessment - 04/01/19 1624    Subjective  Pt reports pain has changed since eval - now more dull in nature with lateral spreading from sacrum, R>L.    Patient Stated Goals  "strengthening to help with pain"    Currently in Pain?  Yes    Pain Score  2    ranging from 3-6/10 since last visit   Pain Location  Sacrum    Pain  Orientation  Right;Left   R>L   Pain Descriptors / Indicators  Dull    Pain Type  Chronic pain    Pain Frequency  Constant                       [facility] Adult PT Treatment/Exercise - 04/01/19 1620      Self-Care   Self-Care  Posture    Posture  Provided instruction in proper posture and body mechanics for typical daily tasks to minimize LBP and risk of SIJ malalignment.      Exercises   Exercises  Lumbar      Lumbar Exercises: Stretches   Passive Hamstring Stretch  Right;Left;30 seconds;1 rep    Passive Hamstring Stretch Limitations  supine with strap - cues to avoid stretching beyond initiation of knee flexion      Lumbar Exercises: Aerobic   Recumbent Bike  L1 x 6 min      Lumbar Exercises: Supine   Pelvic Tilt  10 reps;5 seconds    Pelvic Tilt Limitations  Kegel + posterior pelvic tilt    Clam  10 reps;3 seconds    Clam Limitations  hooklying alt hip ABD/ER wit red TB - cues to slow pace and avoid trunk rotation    Bridge with clamshell  10 reps;5 seconds    Bridge with [employer] Limitations  +  hip ABD isometric with red TB      Lumbar Exercises: Sidelying   Clam  Right;Left;10 reps;3 seconds    Clam Limitations  red TB      Modalities   Modalities  Electrical Stimulation;Moist Heat      Moist Heat Therapy   Number Minutes Moist Heat  15 Minutes    Moist Heat Location  Lumbar Spine   lumbosacral spine     Electrical Stimulation   Electrical Stimulation Location  B SIJ    Electrical Stimulation Action  IFC    Electrical Stimulation Parameters  80-150 Hz, intensity to pt tol x 15'    Electrical Stimulation Goals  Pain;Tone             PT Education - 04/01/19 1700    Education Details  Posture and body mechanics education; Role of estim and expected response to treatment    Person(s) Educated  Patient    Methods  Explanation;Demonstration;Handout    Comprehension  Verbalized understanding;Returned demonstration;Need further instruction        PT Short Term Goals - 04/01/19 1628      PT SHORT TERM GOAL #1   Title  Patient will be independent with initial HEP    Status  On-going    Target Date  04/17/19      PT SHORT TERM GOAL #2   Title  Patient will verbalize/demonstrate understanding of neutral spine posture and proper body mechanics to reduce strain on lumbar spine and SIJs    Status  On-going    Target Date  04/17/19        PT Long Term Goals - 04/01/19 1628      PT LONG TERM GOAL #1   Title  Patient will be independent with ongoing/advanced HEP    Status  On-going    Target Date  05/08/19      PT LONG TERM GOAL #2   Title  Patient to demonstrate appropriate posture and body mechanics needed for daily activities    Status  On-going    Target Date  05/08/19      PT LONG TERM GOAL #3   Title  Patient will maintain neutral SIJ alignment w/o need for correction for >/= 2 weeks    Status  On-going    Target Date  05/08/19      PT LONG TERM GOAL #4   Title  Patient to report pain reduction in frequency and intensity by >/= 50%    Status  On-going    Target Date  05/08/19      PT LONG TERM GOAL #5   Title  Patient to report ability to perform ADLs, household, child-care and work-related tasks without increased lumbosacral pain    Status  On-going    Target Date  05/08/19            Plan - 04/01/19 1629    Clinical Impression Statement  Amaiah reporting her pain has changed since SIJ adjustment on eval - now more dull in nature with decreased overall intensity although more lateral spreading from sacrum, R>L. She admits to limited opportunity to complete HEP since eval and required clarification for technique and/or theraband placement with all exercises on review today. Provided instruction in proper posture and body mechanics for typical daily activities, highlighting proper technique for picking up her children as well as positioning for neutral spinal posture in supine, sitting and standing, avoiding  slouching in upright positions. Session concluded with trial of estim  and moist heat to low back/sacrum at patient request, with patient noting benefit from this. Patient very inquisitive throughout session with PT taking time to answer all questions.    Personal Factors and Comorbidities  Comorbidity 2;Time since onset of injury/illness/exacerbation    Comorbidities  s/p C-section x 3; vitamin-D deficiency    Rehab Potential  Good    PT Frequency  2x / week   pt currently only scheduling 1x/wk per her preference   PT Duration  6 weeks    PT Treatment/Interventions  ADLs/Self Care Home Management;Cryotherapy;Electrical Stimulation;Iontophoresis 4mg /ml Dexamethasone;Moist Heat;Traction;Ultrasound;Functional mobility training;Therapeutic activities;Therapeutic exercise;Neuromuscular re-education;Patient/family education;Manual techniques;Passive range of motion;Dry needling;Taping;Spinal Manipulations;Joint Manipulations    PT Next Visit Plan  Reassess SIJ alignment as indicated; Address and questions re: posture & body mechanics education; Lumbopelvic strengthening; Manual therapy as indicated; Modalities as indicated for pain    PT Home Exercise Plan  03/27/19 - HS stretch, pelvic tilt, bridge + red TB hip ABD isometric, hooklying alt clams with red TB    Consulted and Agree with Plan of Care  Patient       Patient will benefit from skilled therapeutic intervention in order to improve the following deficits and impairments:  Abnormal gait, Decreased activity tolerance, Decreased endurance, Decreased knowledge of precautions, Decreased mobility, Decreased strength, Hypermobility, Hypomobility, Increased muscle spasms, Impaired perceived functional ability, Impaired flexibility, Improper body mechanics, Postural dysfunction, Pain  Visit Diagnosis: Sacrococcygeal disorders, not elsewhere classified  Chronic midline low back pain without sciatica  Muscle weakness (generalized)     Problem  List Patient Active Problem List   Diagnosis Date Noted  . Failed trial of labor following previous cesarean, antepartum 02/28/2015  . Postpartum care following cesarean delivery (9/23) 02/26/2015    Percival Spanish, PT, MPT 04/01/2019, 7:25 PM  [facility] [address]  [place], [place], [address] Phone: [phone]   Fax:  [phone]  Name: Tamikka Pilger MRN: [id number] Date of Birth: 1986-01-19

## 2019-04-08 ENCOUNTER — Ambulatory Visit: Payer: 59 | Attending: Internal Medicine | Admitting: Physical Therapy

## 2019-04-08 ENCOUNTER — Encounter: Payer: Self-pay | Admitting: Physical Therapy

## 2019-04-08 ENCOUNTER — Other Ambulatory Visit: Payer: Self-pay

## 2019-04-08 DIAGNOSIS — M6281 Muscle weakness (generalized): Secondary | ICD-10-CM | POA: Diagnosis not present

## 2019-04-08 DIAGNOSIS — M545 Low back pain, unspecified: Secondary | ICD-10-CM

## 2019-04-08 DIAGNOSIS — M533 Sacrococcygeal disorders, not elsewhere classified: Secondary | ICD-10-CM | POA: Insufficient documentation

## 2019-04-08 DIAGNOSIS — G8929 Other chronic pain: Secondary | ICD-10-CM | POA: Insufficient documentation

## 2019-04-08 NOTE — Therapy (Signed)
[facility] [address]  [place], [place], [address] Phone: [phone]   Fax:  [phone]  Physical Therapy Treatment  Patient Details  Name: Kristin Gonzalez MRN: [id number] Date of Birth: [birth year] Referring Provider (PT): Charna Archer, MD   Encounter Date: 04/08/2019  PT End of Session - 04/08/19 1535    Visit Number  3    Number of Visits  12    Authorization Type  [facility]    PT Start Time  1535    PT Stop Time  1628    PT Time Calculation (min)  53 min    Activity Tolerance  Patient tolerated treatment well    Behavior During Therapy  [facility] for tasks assessed/performed       Past Medical History:  Diagnosis Date  . Medical history non-contributory   . Postpartum care following cesarean delivery (8/28) 01/30/2013  . Postpartum care following cesarean delivery (9/23) 02/26/2015  . Postpartum care following cesarean delivery (9/23) 02/26/2015  . Vitamin D deficiency     Past Surgical History:  Procedure Laterality Date  . CESAREAN SECTION N/A 01/30/2013   Procedure: CESAREAN SECTION;  Surgeon: Elveria Royals, MD;  Location: [facility];  Service: Obstetrics;  Laterality: N/A;  . CESAREAN SECTION N/A 02/26/2015   Procedure: Repeat CESAREAN SECTION;  Surgeon: Servando Salina, MD;  Location: [facility];  Service: Obstetrics;  Laterality: N/A;  . CESAREAN SECTION N/A 10/24/2017   Procedure: Repeat CESAREAN SECTION;  Surgeon: Servando Salina, MD;  Location: [place];  Service: Obstetrics;  Laterality: N/A;  EDD: 10/30/17    There were no vitals filed for this visit.  Subjective Assessment - 04/08/19 1538    Subjective  Pt reporting pain was very bad on Sat while working, causing her to have to sit down frequently during her shift. Has reached out to Dr. Ninfa Linden about possibility of a Toradol shot, but waiting to hear back.    Patient Stated Goals  "strengthening to help with pain"    Currently in Pain?  Yes     Pain Score  5    4-5/10   Pain Location  Sacrum    Pain Descriptors / Indicators  Throbbing   "pulsating"                      [facility] Adult PT Treatment/Exercise - 04/08/19 1535      Exercises   Exercises  Lumbar      Lumbar Exercises: Aerobic   Elliptical  L2.0 x 6 min      Lumbar Exercises: Supine   Dead Bug  10 reps;3 seconds    Dead Bug Limitations  from hooklying    Isometric Hip Flexion  10 reps;5 seconds    Isometric Hip Flexion Limitations  Bil with lower legs supported on peanut ball      Manual Therapy   Manual Therapy  Muscle Energy Technique;Taping    Muscle Energy Technique  Pt guided in self-MET to correct apparent L innominate anterior rotation vs R posterior innominate posterior rotation followed by manual pelvic shotgun by PT - cavitation of pubic symphysis felt/heard with alignment normalized following 2nd round of MET     Kinesiotex  Create Space      Kinesiotix   Create Space  50% horiz strip over B SIJ with 50% "X" to create stars over B SIJ; 50% stars over B greater trochanteric bursa  PT Short Term Goals - 04/01/19 1628      PT SHORT TERM GOAL #1   Title  Patient will be independent with initial HEP    Status  On-going    Target Date  04/17/19      PT SHORT TERM GOAL #2   Title  Patient will verbalize/demonstrate understanding of neutral spine posture and proper body mechanics to reduce strain on lumbar spine and SIJs    Status  On-going    Target Date  04/17/19        PT Long Term Goals - 04/01/19 1628      PT LONG TERM GOAL #1   Title  Patient will be independent with ongoing/advanced HEP    Status  On-going    Target Date  05/08/19      PT LONG TERM GOAL #2   Title  Patient to demonstrate appropriate posture and body mechanics needed for daily activities    Status  On-going    Target Date  05/08/19      PT LONG TERM GOAL #3   Title  Patient will maintain neutral SIJ alignment w/o need for  correction for >/= 2 weeks    Status  On-going    Target Date  05/08/19      PT LONG TERM GOAL #4   Title  Patient to report pain reduction in frequency and intensity by >/= 50%    Status  On-going    Target Date  05/08/19      PT LONG TERM GOAL #5   Title  Patient to report ability to perform ADLs, household, child-care and work-related tasks without increased lumbosacral pain    Status  On-going    Target Date  05/08/19            Plan - 04/08/19 1541    Clinical Impression Statement  Kristin Gonzalez reporting 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 States she wore her SI belt to work on Sunday with pain somewhat better controlled. SIJ assessment today revealing apparent L innominate anterior rotation vs R posterior innominate posterior rotation - patient guided in self-MET correction followed by manual pelvic shotgun by PT - cavitation of pubic symphysis felt/heard with alignment normalized following 2nd round of MET. Continued exercise focus on lumbopelvic strengthening to promote improved SIJ stability and decrease LBP. Patient would potentially benefit from ionto patch(es) to SIJ as well as B greater trochanteric bursa given ongoing inflammation and ttp, however POC including ionto orders yet to be signed by referring MD. As such initiated trial of kinesiotaping for B SIJ and greater trochanteric bursa and will assess response on next visit.    Personal Factors and Comorbidities  Comorbidity 2;Time since onset of injury/illness/exacerbation    Comorbidities  s/p C-section x 3; vitamin-D deficiency    Rehab Potential  Good    PT Frequency  2x / week   pt currently only scheduling 1x/wk per her preference   PT Duration  6 weeks    PT Treatment/Interventions  ADLs/Self Care Home Management;Cryotherapy;Electrical Stimulation;Iontophoresis 66m/ml Dexamethasone;Moist Heat;Traction;Ultrasound;Functional mobility  training;Therapeutic activities;Therapeutic exercise;Neuromuscular re-education;Patient/family education;Manual techniques;Passive range of motion;Dry needling;Taping;Spinal Manipulations;Joint Manipulations    PT Next Visit Plan  Assess response to kinesiotaping; Reassess SIJ alignment as indicated; Address and questions re: posture & body mechanics education; Lumbopelvic strengthening; Manual therapy as indicated; Modalities as indicated for pain    PT Home Exercise Plan  03/27/19 - HS stretch, pelvic tilt, bridge +  red TB hip ABD isometric, hooklying alt clams with red TB    Consulted and Agree with Plan of Care  Patient       Patient will benefit from skilled therapeutic intervention in order to improve the following deficits and impairments:  Abnormal gait, Decreased activity tolerance, Decreased endurance, Decreased knowledge of precautions, Decreased mobility, Decreased strength, Hypermobility, Hypomobility, Increased muscle spasms, Impaired perceived functional ability, Impaired flexibility, Improper body mechanics, Postural dysfunction, Pain  Visit Diagnosis: Sacrococcygeal disorders, not elsewhere classified  Chronic midline low back pain without sciatica  Muscle weakness (generalized)     Problem List Patient Active Problem List   Diagnosis Date Noted  . Failed trial of labor following previous cesarean, antepartum 02/28/2015  . Postpartum care following cesarean delivery (9/23) 02/26/2015    Percival Spanish, PT, MPT 04/08/2019, 7:40 PM  [facility] [address]  [place], [place], [address] Phone: [phone]   Fax:  [phone]  Name: Kristin Gonzalez MRN: [id number] Date of Birth: [birth year]

## 2019-04-10 ENCOUNTER — Encounter: Payer: 59 | Admitting: Physical Therapy

## 2019-04-15 ENCOUNTER — Other Ambulatory Visit: Payer: Self-pay

## 2019-04-15 ENCOUNTER — Ambulatory Visit: Payer: 59 | Admitting: Physical Therapy

## 2019-04-15 DIAGNOSIS — G8929 Other chronic pain: Secondary | ICD-10-CM | POA: Diagnosis not present

## 2019-04-15 DIAGNOSIS — M6281 Muscle weakness (generalized): Secondary | ICD-10-CM | POA: Diagnosis not present

## 2019-04-15 DIAGNOSIS — M545 Low back pain: Secondary | ICD-10-CM | POA: Diagnosis not present

## 2019-04-15 DIAGNOSIS — M533 Sacrococcygeal disorders, not elsewhere classified: Secondary | ICD-10-CM | POA: Diagnosis not present

## 2019-04-15 NOTE — Therapy (Signed)
[facility] [address]  [place], [place], [address] Phone: [phone]   Fax:  [phone]  Physical Therapy Treatment  Patient Details  Name: Kristin Gonzalez MRN: [id number] Date of Birth: 1986/05/13 Referring Provider (PT): Charna Archer, MD   Encounter Date: 04/15/2019  PT End of Session - 04/15/19 1537    Visit Number  4    Number of Visits  12    Date for PT Re-Evaluation  05/08/19    Authorization Type  [facility]    PT Start Time  1537   Pt arrived late   PT Stop Time  1618    PT Time Calculation (min)  41 min    Activity Tolerance  Patient tolerated treatment well    Behavior During Therapy  [facility] for tasks assessed/performed       Past Medical History:  Diagnosis Date  . Medical history non-contributory   . Postpartum care following cesarean delivery (8/28) 01/30/2013  . Postpartum care following cesarean delivery (9/23) 02/26/2015  . Postpartum care following cesarean delivery (9/23) 02/26/2015  . Vitamin D deficiency     Past Surgical History:  Procedure Laterality Date  . CESAREAN SECTION N/A 01/30/2013   Procedure: CESAREAN SECTION;  Surgeon: Elveria Royals, MD;  Location: [facility];  Service: Obstetrics;  Laterality: N/A;  . CESAREAN SECTION N/A 02/26/2015   Procedure: Repeat CESAREAN SECTION;  Surgeon: Servando Salina, MD;  Location: [place] [facility];  Service: Obstetrics;  Laterality: N/A;  . CESAREAN SECTION N/A 10/24/2017   Procedure: Repeat CESAREAN SECTION;  Surgeon: Servando Salina, MD;  Location: [place];  Service: Obstetrics;  Laterality: N/A;  EDD: 10/30/17    There were no vitals filed for this visit.  Subjective Assessment - 04/15/19 1540    Subjective  Pt reporting pain seems to be becoming more manageable. Notes great benefit from taping.    Patient Stated Goals  "strengthening to help with pain"    Currently in Pain?  Yes    Pain Score  3     Pain Location  Hip    Pain  Orientation  Left    Pain Descriptors / Indicators  Sore    Pain Type  Chronic pain    Pain Frequency  Intermittent    Aggravating Factors   changing positions                       [facility] Adult PT Treatment/Exercise - 04/15/19 1537      Self-Care   Self-Care  Other Self-Care Comments    Other Self-Care Comments   Educated pt on kinesiotape application for SIJ and greater trochanteric bursa.      Exercises   Exercises  Lumbar      Lumbar Exercises: Aerobic   Recumbent Bike  L2 x 6 min      Manual Therapy   Manual Therapy  Muscle Energy Technique;Taping    Muscle Energy Technique  Reviewed supine and introduced standing self-MET techniques to correct apparent L innominate anterior rotation followed by self-mobs for sacral and pubic gapping. Patient with difficulty reducing rotation with self-mobilization, therefore PT performing manual MET with alignment normalized.      Kinesiotix   Create Space  50% horiz strip over B SIJ with 50% "X" to create stars over B SIJ; 50% stars over L greater trochanteric bursa               PT Short Term Goals -  04/15/19 1539      PT SHORT TERM GOAL #1   Title  Patient will be independent with initial HEP    Status  On-going    Target Date  04/17/19      PT SHORT TERM GOAL #2   Title  Patient will verbalize/demonstrate understanding of neutral spine posture and proper body mechanics to reduce strain on lumbar spine and SIJs    Status  On-going    Target Date  04/17/19        PT Long Term Goals - 04/01/19 1628      PT LONG TERM GOAL #1   Title  Patient will be independent with ongoing/advanced HEP    Status  On-going    Target Date  05/08/19      PT LONG TERM GOAL #2   Title  Patient to demonstrate appropriate posture and body mechanics needed for daily activities    Status  On-going    Target Date  05/08/19      PT LONG TERM GOAL #3   Title  Patient will maintain neutral SIJ alignment w/o need for correction  for >/= 2 weeks    Status  On-going    Target Date  05/08/19      PT LONG TERM GOAL #4   Title  Patient to report pain reduction in frequency and intensity by >/= 50%    Status  On-going    Target Date  05/08/19      PT LONG TERM GOAL #5   Title  Patient to report ability to perform ADLs, household, child-care and work-related tasks without increased lumbosacral pain    Status  On-going    Target Date  05/08/19            Plan - 04/15/19 1540    Clinical Impression Statement  Kristin Gonzalez reporting pain is becoming more manageable and noting great benefit from taping with feeling of good support and decreased pain during transitional movements. Taping lasting until end of week but removed prior to working her weekend shift. Increased pain reported at beginning of Saturday shift but lessened at the day and weekend progressed and not necessitating need for her to wear her SI belt to make it through her shifts.  Pain less severe today and more localized to L side with SIJ assessment today revealing apparent L innominate anterior rotation. Reviewed self-MET correction techniques with no cavitation audible but alignment normalized after multiple MET cycles. As patient noting benefit from kinesiotaping last visit and cert orders remaining unsigned negating ability to trial ionto patch, reapplied kinesiotape for B SIJ and L greater trochanteric bursa while providing education in application technique for patient to have husband help with application as necessary.    Personal Factors and Comorbidities  Comorbidity 2;Time since onset of injury/illness/exacerbation    Comorbidities  s/p C-section x 3; vitamin-D deficiency    Rehab Potential  Good    PT Frequency  2x / week   pt currently only scheduling 1x/wk per her preference   PT Duration  6 weeks    PT Treatment/Interventions  ADLs/Self Care Home Management;Cryotherapy;Electrical Stimulation;Iontophoresis 47m/ml Dexamethasone;Moist  Heat;Traction;Ultrasound;Functional mobility training;Therapeutic activities;Therapeutic exercise;Neuromuscular re-education;Patient/family education;Manual techniques;Passive range of motion;Dry needling;Taping;Spinal Manipulations;Joint Manipulations    PT Next Visit Plan  Reassess SIJ alignment as indicated; Address and questions re: posture & body mechanics education; Lumbopelvic strengthening; Manual therapy as indicated; Modalities as indicated for pain    PT Home Exercise Plan  03/27/19 - HS stretch, pelvic  tilt, bridge + red TB hip ABD isometric, hooklying alt clams with red TB    Consulted and Agree with Plan of Care  Patient       Patient will benefit from skilled therapeutic intervention in order to improve the following deficits and impairments:  Abnormal gait, Decreased activity tolerance, Decreased endurance, Decreased knowledge of precautions, Decreased mobility, Decreased strength, Hypermobility, Hypomobility, Increased muscle spasms, Impaired perceived functional ability, Impaired flexibility, Improper body mechanics, Postural dysfunction, Pain  Visit Diagnosis: Sacrococcygeal disorders, not elsewhere classified  Chronic midline low back pain without sciatica  Muscle weakness (generalized)     Problem List Patient Active Problem List   Diagnosis Date Noted  . Failed trial of labor following previous cesarean, antepartum 02/28/2015  . Postpartum care following cesarean delivery (9/23) 02/26/2015    Percival Spanish, PT, MPT 04/15/2019, 6:01 PM  [facility] [address]  [place], [place], [address] Phone: [phone]   Fax:  [phone]  Name: Kristin Gonzalez MRN: [id number] Date of Birth: 1986-03-11

## 2019-04-17 ENCOUNTER — Encounter: Payer: 59 | Admitting: Physical Therapy

## 2019-04-17 DIAGNOSIS — Z3046 Encounter for surveillance of implantable subdermal contraceptive: Secondary | ICD-10-CM | POA: Diagnosis not present

## 2019-04-17 DIAGNOSIS — N920 Excessive and frequent menstruation with regular cycle: Secondary | ICD-10-CM | POA: Diagnosis not present

## 2019-04-17 DIAGNOSIS — R454 Irritability and anger: Secondary | ICD-10-CM | POA: Diagnosis not present

## 2019-04-17 DIAGNOSIS — Z3009 Encounter for other general counseling and advice on contraception: Secondary | ICD-10-CM | POA: Diagnosis not present

## 2019-04-22 ENCOUNTER — Encounter: Payer: Self-pay | Admitting: Physical Therapy

## 2019-04-22 ENCOUNTER — Ambulatory Visit: Payer: 59 | Admitting: Physical Therapy

## 2019-04-22 ENCOUNTER — Other Ambulatory Visit: Payer: Self-pay

## 2019-04-22 ENCOUNTER — Ambulatory Visit (INDEPENDENT_AMBULATORY_CARE_PROVIDER_SITE_OTHER): Payer: 59

## 2019-04-22 ENCOUNTER — Encounter: Payer: Self-pay | Admitting: Orthopaedic Surgery

## 2019-04-22 ENCOUNTER — Ambulatory Visit: Payer: 59 | Admitting: Orthopaedic Surgery

## 2019-04-22 DIAGNOSIS — Z20828 Contact with and (suspected) exposure to other viral communicable diseases: Secondary | ICD-10-CM | POA: Diagnosis not present

## 2019-04-22 DIAGNOSIS — M533 Sacrococcygeal disorders, not elsewhere classified: Secondary | ICD-10-CM

## 2019-04-22 DIAGNOSIS — G8929 Other chronic pain: Secondary | ICD-10-CM | POA: Diagnosis not present

## 2019-04-22 DIAGNOSIS — M6281 Muscle weakness (generalized): Secondary | ICD-10-CM

## 2019-04-22 DIAGNOSIS — M545 Low back pain, unspecified: Secondary | ICD-10-CM

## 2019-04-22 DIAGNOSIS — Z9189 Other specified personal risk factors, not elsewhere classified: Secondary | ICD-10-CM | POA: Diagnosis not present

## 2019-04-22 NOTE — Progress Notes (Signed)
Office Visit Note   Patient: Kristin Gonzalez           Date of Birth: 04-Aug-1985           MRN: [id number] Visit Date: 04/22/2019              Requested by: Ileana Ladd, MD [address] [place],  [place] [address] PCP: Ileana Ladd, MD   Assessment & Plan: Visit Diagnoses:  1. Bilateral low back pain, unspecified chronicity, unspecified whether sciatica present    Plan: I do feel that she would benefit from continued physical therapy since that is been very beneficial to her and she is made great progress with physical therapy.  I did review the therapist notes.  If the pain flares up enough my next recommendation would be considering a fluoroscopic guided SI joint injection which could be done by Dr. Alvester Morin.  Since I know her so well she can call me if she decides to have this done we can set this up.  Therapy can also send this orders for resuming or continuing her therapy for another several weeks.  Follow-Up Instructions: Return if symptoms worsen or fail to improve.   Orders:  Orders Placed This Encounter  Procedures  . XR Lumbar Spine 2-3 Views   No orders of the defined types were placed in this encounter.     Procedures: No procedures performed   Clinical Data: No additional findings.   Subjective: Chief Complaint  Patient presents with  . Lower Back - Pain  Kristin Gonzalez is actually well-known to me.  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  I have actually operated on her father as well.  She has been dealing with low back pain and posterior pelvic pain for several years now.  It does get worse at times and sometimes does get better.  She has been going through extensive physical therapy for this and I been able to review the notes in the [facility] system she denies any radicular symptoms.  She denies any weakness.  She does have a history of 3 cesarean sections.  Physical therapy has helped and right now she is not feeling as bad as she had before but she does  point to the SI joint area of both sides the source of her pain.  There is a small aspect of trochanteric bursitis as well.  She denies any change in bowel or bladder function.  She does not really take any medications for this.  Physical therapy has helped and she is still in PT.  HPI  Review of Systems She currently denies any headache, chest pain, shortness of breath, fever, chills, nausea, vomiting  Objective: Vital Signs: There were no vitals taken for this visit.  Physical Exam She is alert and orient x3 and in no acute distress Ortho Exam is Examination both hips show that they move fluidly and smoothly.  When I had her lay in a supine position her leg lengths are entirely equal.  She has 5 out of 5 strength in all muscle groups and normal sensation.  She has excellent mobility of her lumbar spine with full flexion and extension.  She does hurt posteriorly over the SI joints bilaterally but it is just mild. Specialty Comments:  No specialty comments available.  Imaging: Xr Lumbar Spine 2-3 Views  Result Date: 04/22/2019 An AP and lateral of the lumbar spine shows no acute findings.  The alignment is anatomic.  The  disc heights are well-maintained.  The AP view does show an AP of both hips that appear normal.  The SI joints can be seen and are both open.    PMFS History: Patient Active Problem List   Diagnosis Date Noted  . Failed trial of labor following previous cesarean, antepartum 02/28/2015  . Postpartum care following cesarean delivery (9/23) 02/26/2015   Past Medical History:  Diagnosis Date  . Medical history non-contributory   . Postpartum care following cesarean delivery (8/28) 01/30/2013  . Postpartum care following cesarean delivery (9/23) 02/26/2015  . Postpartum care following cesarean delivery (9/23) 02/26/2015  . Vitamin D deficiency     Family History  Problem Relation Age of Onset  . Hyperlipidemia Mother   . Hypertension Father     Past Surgical  History:  Procedure Laterality Date  . CESAREAN SECTION N/A 01/30/2013   Procedure: CESAREAN SECTION;  Surgeon: Elveria Royals, MD;  Location: [facility];  Service: Obstetrics;  Laterality: N/A;  . CESAREAN SECTION N/A 02/26/2015   Procedure: Repeat CESAREAN SECTION;  Surgeon: Servando Salina, MD;  Location: [facility];  Service: Obstetrics;  Laterality: N/A;  . CESAREAN SECTION N/A 10/24/2017   Procedure: Repeat CESAREAN SECTION;  Surgeon: Servando Salina, MD;  Location: [place];  Service: Obstetrics;  Laterality: N/A;  EDD: 10/30/17   Social History   Occupational History  . Not on file  Tobacco Use  . Smoking status: Former Smoker    Types: Cigarettes  . Smokeless tobacco: Never Used  Substance and Sexual Activity  . Alcohol use: No  . Drug use: No  . Sexual activity: Yes    Birth control/protection: Implant

## 2019-04-22 NOTE — Therapy (Addendum)
[facility] [place] [address]  [place], [place], [address] Phone: [phone]   Fax:  [phone]  Physical Therapy Treatment / Discharge Summary  Patient Details  Name: Kristin Gonzalez MRN: [id number] Date of Birth: 04-02-1986 Referring Provider (PT): Charna Archer, MD   Encounter Date: 04/22/2019  PT End of Session - 04/22/19 1616    Visit Number  5    Number of Visits  12    Date for PT Re-Evaluation  05/08/19    Authorization Type  [facility]    PT Start Time  1616    PT Stop Time  1720    PT Time Calculation (min)  64 min    Activity Tolerance  Patient tolerated treatment well    Behavior During Therapy  [facility] for tasks assessed/performed       Past Medical History:  Diagnosis Date  . Medical history non-contributory   . Postpartum care following cesarean delivery (8/28) 01/30/2013  . Postpartum care following cesarean delivery (9/23) 02/26/2015  . Postpartum care following cesarean delivery (9/23) 02/26/2015  . Vitamin D deficiency     Past Surgical History:  Procedure Laterality Date  . CESAREAN SECTION N/A 01/30/2013   Procedure: CESAREAN SECTION;  Surgeon: Elveria Royals, MD;  Location: [facility];  Service: Obstetrics;  Laterality: N/A;  . CESAREAN SECTION N/A 02/26/2015   Procedure: Repeat CESAREAN SECTION;  Surgeon: Servando Salina, MD;  Location: [facility];  Service: Obstetrics;  Laterality: N/A;  . CESAREAN SECTION N/A 10/24/2017   Procedure: Repeat CESAREAN SECTION;  Surgeon: Servando Salina, MD;  Location: [facility];  Service: Obstetrics;  Laterality: N/A;  EDD: 10/30/17    There were no vitals filed for this visit.  Subjective Assessment - 04/22/19 1621    Subjective  Pt reporting her pain has been "so much better" since last visit. States she saw Dr. Ninfa Linden who did not recommend any further testing/intervention other than continuing PT.    Patient Stated Goals  "strengthening to help with pain"     Currently in Pain?  Yes    Pain Score  1     Pain Location  Hip   & SIJ   Pain Orientation  Left    Pain Descriptors / Indicators  Sore    Pain Type  Chronic pain    Pain Frequency  Intermittent                       [facility] Adult PT Treatment/Exercise - 04/22/19 1616      Lumbar Exercises: Stretches   Quadruped Mid Back Stretch  30 seconds;3 reps    Quadruped Mid Back Stretch Limitations  3-way prayer stretch/child's pose    ITB Stretch  Right;Left;30 seconds;1 rep      Lumbar Exercises: Supine   Bridge with clamshell  10 reps;5 seconds    Bridge with [employer] Limitations  + alt hip ABD/ER with green TB      Lumbar Exercises: Sidelying   Clam  Right;Left;10 reps;3 seconds    Clam Limitations  green TB      Lumbar Exercises: Quadruped   [place]cat/Old Horse  10 reps   5" hold     Manual Therapy   Manual Therapy  Taping;Other (comment)    Other Manual Therapy  Instructed pt in self-STM to glutes using small ball on wall.    [place]  50% horiz strip over B SIJ with 50% "X" to create stars over B SIJ; 50% stars over B greater trochanteric bursa             PT Education - 04/22/19 1700    Education Details  HEP progression - 3-way prayer stetch, cat/camel, bridge clam & sidelying clam (TB progressed to green)    Person(s) Educated  Patient    Methods  Explanation;Demonstration;Handout    Comprehension  Verbalized understanding;Returned demonstration;Need further instruction       PT Short Term Goals - 04/22/19 1627      PT SHORT TERM GOAL #1   Title  Patient will be independent with initial HEP    Status  Achieved   04/22/19   Target Date  --      PT SHORT TERM GOAL #2   Title  Patient will verbalize/demonstrate understanding of neutral spine posture and proper body mechanics to reduce strain on lumbar spine and SIJs    Status  Achieved   04/22/19   Target Date  --        PT Long Term  Goals - 04/01/19 1628      PT LONG TERM GOAL #1   Title  Patient will be independent with ongoing/advanced HEP    Status  On-going    Target Date  05/08/19      PT LONG TERM GOAL #2   Title  Patient to demonstrate appropriate posture and body mechanics needed for daily activities    Status  On-going    Target Date  05/08/19      PT LONG TERM GOAL #3   Title  Patient will maintain neutral SIJ alignment w/o need for correction for >/= 2 weeks    Status  On-going    Target Date  05/08/19      PT LONG TERM GOAL #4   Title  Patient to report pain reduction in frequency and intensity by >/= 50%    Status  On-going    Target Date  05/08/19      PT LONG TERM GOAL #5   Title  Patient to report ability to perform ADLs, household, child-care and work-related tasks without increased lumbosacral pain    Status  On-going    Target Date  05/08/19            Plan - 04/22/19 1720    Clinical Impression Statement  Kristin Gonzalez reporting pain has been much better this week with only very mild residual soreness. SIJ reassessed with normal alignment. Patient requesting clarification of posture and body mechanics training as it relates to pick up her youngest child, sleeping and sitting watching TV - all concerns addressed. Able to progress lumbopelvic strengthening with increased complexity of exercises as well as progression of TheraBand resistance to green TB with good tolerance - HEP updated accordingly.  All STGs now met. Patient requesting reapplication of kinesiotape as she continues to note benefit from this.    Personal Factors and Comorbidities  Comorbidity 2;Time since onset of injury/illness/exacerbation    Comorbidities  s/p C-section x 3; vitamin-D deficiency    Rehab Potential  Good    PT Frequency  2x / week   pt currently only scheduling 1x/wk per her preference   PT Duration  6 weeks    PT Treatment/Interventions  ADLs/Self Care Home Management;Cryotherapy;Electrical  Stimulation;Iontophoresis 25m/ml Dexamethasone;Moist Heat;Traction;Ultrasound;Functional mobility training;Therapeutic activities;Therapeutic exercise;Neuromuscular re-education;Patient/family education;Manual techniques;Passive range of motion;Dry needling;Taping;Spinal Manipulations;Joint Manipulations    PT Next Visit Plan  Reassess SIJ alignment as indicated; Address and questions re: posture & body mechanics education; Lumbopelvic strengthening; Manual therapy as indicated; Modalities as indicated for pain    PT Home Exercise Plan  03/27/19 - HS stretch, pelvic tilt, bridge + red TB hip ABD isometric, hooklying alt clams with red TB; 04/22/19 - 3-way prayer stetch, cat/camel, bridge clam & sidelying clam (TB progressed to green)    Consulted and Agree with Plan of Care  Patient       Patient will benefit from skilled therapeutic intervention in order to improve the following deficits and impairments:  Abnormal gait, Decreased activity tolerance, Decreased endurance, Decreased knowledge of precautions, Decreased mobility, Decreased strength, Hypermobility, Hypomobility, Increased muscle spasms, Impaired perceived functional ability, Impaired flexibility, Improper body mechanics, Postural dysfunction, Pain  Visit Diagnosis: Sacrococcygeal disorders, not elsewhere classified  Chronic midline low back pain without sciatica  Muscle weakness (generalized)     Problem List Patient Active Problem List   Diagnosis Date Noted  . Failed trial of labor following previous cesarean, antepartum 02/28/2015  . Postpartum care following cesarean delivery (9/23) 02/26/2015    Percival Spanish, PT, MPT 04/22/2019, 5:35 PM  [facility] [address]  [place], [place], [address] Phone: [phone]   Fax:  [phone]  Name: Kristin Gonzalez MRN: [id number] Date of Birth: 10-25-1985   PHYSICAL THERAPY DISCHARGE SUMMARY  Visits from Start  of Care: 5  Current functional level related to goals / functional outcomes:   Refer to above clinical impression for status as of last visit on 04/22/19. As of 04/29/19, the patient called to cancel all future appointments stating she was doing much better. She was placed on hold for 30 days at her request and has not needed to return to PT, therefore will proceed with discharge from PT for this episode.   Remaining deficits:   As above. Unable to formally assess status at discharge due to failure to return.   Education / Equipment:   HEP, Training and development officer education  Plan: Patient agrees to discharge.  Patient goals were partially met. Patient is being discharged due to being pleased with the current functional level.  ?????     Percival Spanish, PT, MPT 05/28/19, 8:08 AM  [facility] [address]  [place], [place], [address] Phone: [phone]   Fax:  [phone]

## 2019-04-22 NOTE — Patient Instructions (Signed)
    Home exercise program created by JoAnne Kreis, PT.  For questions, please contact JoAnne via phone at [phone] or email at [email or web address]  [facility] [place] [address]  [address] [place], , [address] Phone: [phone]   Fax:  [phone]    

## 2019-04-29 ENCOUNTER — Ambulatory Visit: Payer: 59 | Admitting: Physical Therapy

## 2019-05-06 ENCOUNTER — Ambulatory Visit: Payer: 59 | Admitting: Physical Therapy

## 2019-05-23 DIAGNOSIS — Z20828 Contact with and (suspected) exposure to other viral communicable diseases: Secondary | ICD-10-CM | POA: Diagnosis not present

## 2019-06-11 ENCOUNTER — Other Ambulatory Visit: Payer: Self-pay | Admitting: Obstetrics & Gynecology

## 2019-06-20 ENCOUNTER — Encounter: Payer: Self-pay | Admitting: Family

## 2019-06-20 ENCOUNTER — Telehealth: Payer: Self-pay | Admitting: Family

## 2019-06-20 ENCOUNTER — Ambulatory Visit (INDEPENDENT_AMBULATORY_CARE_PROVIDER_SITE_OTHER): Payer: No Typology Code available for payment source | Admitting: Family

## 2019-06-20 ENCOUNTER — Other Ambulatory Visit: Payer: Self-pay

## 2019-06-20 VITALS — BP 111/76 | HR 74 | Temp 97.6°F | Resp 16 | Ht 59.0 in | Wt 121.0 lb

## 2019-06-20 DIAGNOSIS — T7840XA Allergy, unspecified, initial encounter: Secondary | ICD-10-CM

## 2019-06-20 DIAGNOSIS — Z0001 Encounter for general adult medical examination with abnormal findings: Secondary | ICD-10-CM

## 2019-06-20 DIAGNOSIS — L989 Disorder of the skin and subcutaneous tissue, unspecified: Secondary | ICD-10-CM | POA: Diagnosis not present

## 2019-06-20 DIAGNOSIS — E559 Vitamin D deficiency, unspecified: Secondary | ICD-10-CM | POA: Diagnosis not present

## 2019-06-20 DIAGNOSIS — Z0000 Encounter for general adult medical examination without abnormal findings: Secondary | ICD-10-CM

## 2019-06-20 NOTE — Addendum Note (Signed)
Addended by: Mervin Kung A on: 06/20/2019 02:59 PM   Modules accepted: Orders

## 2019-06-20 NOTE — Telephone Encounter (Signed)
Release of information form faxed requesting records

## 2019-06-20 NOTE — Telephone Encounter (Signed)
Please call Dr. Camillia Herter office to request last tetanus date and pap?

## 2019-06-20 NOTE — Patient Instructions (Addendum)
Consider adding zytec 10mg  once daily for ear itching to see if it helps.   Try to add in some regular exercise. Work on quitting smoking. Welcome to !

## 2019-06-20 NOTE — Progress Notes (Signed)
Subjective:    Patient ID: Kristin Gonzalez, female    DOB: 11-22-1985, 34 y.o.   MRN: [id number]  HPI   Patient is a 34 yr old female who presents today to establish care.  Pmhx is significant for vitamin D deficiency and 3 c-sections.  Tobacco abuse-has started back smoking.  Tried wellbutrin, "made my brain tingle" reports that she smokes 8 cigarettes/day. Not currently breastfeeding.    Immunizations:  Tetanus up to date- from GYN Diet: cooks a lot. Kosher meat Exercise: not exercising regularly Pap Smear: 2020 with Dr. Benjie Karvonen Vision: up to date Dental: up to date  Ear itching, nasal drainage- occur at night.  Review of Systems  Constitutional: Negative for unexpected weight change.  HENT: Negative for hearing loss and rhinorrhea.        Notes itching in her ears  Eyes: Negative for visual disturbance.  Respiratory: Negative for cough and shortness of breath.   Cardiovascular: Negative for leg swelling.  Gastrointestinal: Negative for constipation and diarrhea.  Genitourinary: Negative for dysuria, frequency and hematuria.  Musculoskeletal: Negative for arthralgias and myalgias.  Skin: Negative for rash.       Has a mole on left temple would like referral to derm for skin check  Neurological: Negative for headaches.  Hematological: Negative for adenopathy.  Psychiatric/Behavioral:       Denies depression/anxiety        Past Medical History:  Diagnosis Date  . Medical history non-contributory   . Postpartum care following cesarean delivery (8/28) 01/30/2013  . Postpartum care following cesarean delivery (9/23) 02/26/2015  . Postpartum care following cesarean delivery (9/23) 02/26/2015  . Vitamin D deficiency      Social History   Socioeconomic History  . Marital status: Married    Spouse name: Not on file  . Number of children: 3  . Years of education: Not on file  . Highest education level: Not on file  Occupational History  . Not on file  Tobacco Use  . Smoking  status: Current Every Day Smoker    Types: Cigarettes  . Smokeless tobacco: Never Used  Substance and Sexual Activity  . Alcohol use: No  . Drug use: No  . Sexual activity: Yes    Birth control/protection: Pill  Other Topics Concern  . Not on file  Social History Narrative  . Not on file   Social Determinants of Health   Financial Resource Strain:   . Difficulty of Paying Living Expenses: Not on file  Food Insecurity:   . Worried About Charity fundraiser in the Last Year: Not on file  . Ran Out of Food in the Last Year: Not on file  Transportation Needs:   . Lack of Transportation (Medical): Not on file  . Lack of Transportation (Non-Medical): Not on file  Physical Activity: Inactive  . Days of Exercise per Week: 0 days  . Minutes of Exercise per Session: 0 min  Stress:   . Feeling of Stress : Not on file  Social Connections:   . Frequency of Communication with Friends and Family: Not on file  . Frequency of Social Gatherings with Friends and Family: Not on file  . Attends Religious Services: Not on file  . Active Member of Clubs or Organizations: Not on file  . Attends Archivist Meetings: Not on file  . Marital Status: Not on file  Intimate Partner Violence: Not At Risk  . Fear of Current or Ex-Partner: No  . Emotionally Abused:  No  . Physically Abused: No  . Sexually Abused: No    Past Surgical History:  Procedure Laterality Date  . CESAREAN SECTION N/A 01/30/2013   Procedure: CESAREAN SECTION;  Surgeon: Robley Fries, MD;  Location: [facility];  Service: Obstetrics;  Laterality: N/A;  . CESAREAN SECTION N/A 02/26/2015   Procedure: Repeat CESAREAN SECTION;  Surgeon: Maxie Better, MD;  Location: [facility];  Service: Obstetrics;  Laterality: N/A;  . CESAREAN SECTION N/A 10/24/2017   Procedure: Repeat CESAREAN SECTION;  Surgeon: Maxie Better, MD;  Location: [facility];  Service: Obstetrics;  Laterality: N/A;  EDD: 10/30/17    Family History    Problem Relation Age of Onset  . Hyperlipidemia Mother   . Hypertension Father     No Known Allergies  Current Outpatient Medications on File Prior to Visit  Medication Sig Dispense Refill  . Ascorbic Acid (VITAMIN C WITH ROSE HIPS) 1000 MG tablet Take 1,000 mg by mouth daily.    Marland Kitchen BIOTIN EXTRA STRENGTH PO Take by mouth.    . Norgestimate-Ethinyl Estradiol Triphasic (TRI-LO-MILI) 0.18/0.215/0.25 MG-25 MCG tab Take 1 tablet by mouth daily.    . Vitamin D, Cholecalciferol, 50 MCG (2000 UT) CAPS Take by mouth.     No current facility-administered medications on file prior to visit.    BP 111/76 (BP Location: Right Arm, Patient Position: Sitting, Cuff Size: Small)   Pulse 74   Temp 97.6 F (36.4 C) (Temporal)   Resp 16   Ht 4\' 11"  (1.499 m)   Wt 121 lb (54.9 kg)   SpO2 98%   BMI 24.44 kg/m    Objective:   Physical Exam  Physical Exam  Constitutional: She is oriented to person, place, and time. She appears well-developed and well-nourished. No distress.  HENT:  Head: Normocephalic and atraumatic.  Right Ear: Tympanic membrane and ear canal normal.  Left Ear: Tympanic membrane and ear canal normal.  Mouth/Throat: Not examined Eyes: Pupils are equal, round, and reactive to light. No scleral icterus.  Neck: Normal range of motion. No thyromegaly present.  Cardiovascular: Normal rate and regular rhythm.   No murmur heard. Pulmonary/Chest: Effort normal and breath sounds normal. No respiratory distress. He has no wheezes. She has no rales. She exhibits no tenderness.  Abdominal: Soft. Bowel sounds are normal. She exhibits no distension and no mass. There is no tenderness. There is no rebound and no guarding.  Musculoskeletal: She exhibits no edema.  Lymphadenopathy:    She has no cervical adenopathy.  Neurological: She is alert and oriented to person, place, and time. She has normal patellar reflexes. She exhibits normal muscle tone. Coordination normal.  Skin: Skin is warm  and dry.  Psychiatric: She has a normal mood and affect. Her behavior is normal. Judgment and thought content normal.  Breast/pelvic: deferred          Assessment & Plan:   Preventative care- encouraged pt to try to exercise regularly. Immunizations up to date, pap up to date.  Tobacco abuse- pt counseled on smoking cessation. Does not wish to try chantix.  We did discuss nicotine patch which she will consider.  Skin lesion- refer to dermatology for evaluation/skin check      Assessment & Plan:

## 2019-06-21 LAB — HEPATIC FUNCTION PANEL
AG Ratio: 1.6 (calc) (ref 1.0–2.5)
ALT: 14 U/L (ref 6–29)
AST: 17 U/L (ref 10–30)
Albumin: 4.6 g/dL (ref 3.6–5.1)
Alkaline phosphatase (APISO): 66 U/L (ref 31–125)
Bilirubin, Direct: 0.1 mg/dL (ref 0.0–0.2)
Globulin: 2.9 g/dL (calc) (ref 1.9–3.7)
Indirect Bilirubin: 0.2 mg/dL (calc) (ref 0.2–1.2)
Total Bilirubin: 0.3 mg/dL (ref 0.2–1.2)
Total Protein: 7.5 g/dL (ref 6.1–8.1)

## 2019-06-21 LAB — LIPID PANEL
Cholesterol: 228 mg/dL — ABNORMAL HIGH (ref <200)
HDL: 56 mg/dL (ref >=50)
LDL Cholesterol (Calc): 138 mg/dL (calc) — ABNORMAL HIGH
Non-HDL Cholesterol (Calc): 172 mg/dL (calc) — ABNORMAL HIGH (ref <130)
Total CHOL/HDL Ratio: 4.1 (calc) (ref <5.0)
Triglycerides: 196 mg/dL — ABNORMAL HIGH (ref <150)

## 2019-06-21 LAB — VITAMIN D 25 HYDROXY (VIT D DEFICIENCY, FRACTURES): Vit D, 25-Hydroxy: 18 ng/mL — ABNORMAL LOW (ref 30–100)

## 2019-06-21 LAB — BASIC METABOLIC PANEL
BUN: 11 mg/dL (ref 7–25)
CO2: 24 mmol/L (ref 20–32)
Calcium: 9.7 mg/dL (ref 8.6–10.2)
Chloride: 101 mmol/L (ref 98–110)
Creat: 0.51 mg/dL (ref 0.50–1.10)
Glucose, Bld: 81 mg/dL (ref 65–99)
Potassium: 4.5 mmol/L (ref 3.5–5.3)
Sodium: 136 mmol/L (ref 135–146)

## 2019-06-21 LAB — CBC WITH DIFFERENTIAL/PLATELET
Absolute Monocytes: 429 cells/uL (ref 200–950)
Basophils Absolute: 79 cells/uL (ref 0–200)
Basophils Relative: 0.7 %
Eosinophils Absolute: 1266 cells/uL — ABNORMAL HIGH (ref 15–500)
Eosinophils Relative: 11.2 %
HCT: 40 % (ref 35.0–45.0)
Hemoglobin: 13.5 g/dL (ref 11.7–15.5)
Lymphs Abs: 4294 cells/uL — ABNORMAL HIGH (ref 850–3900)
MCH: 28.8 pg (ref 27.0–33.0)
MCHC: 33.8 g/dL (ref 32.0–36.0)
MCV: 85.5 fL (ref 80.0–100.0)
MPV: 10.1 fL (ref 7.5–12.5)
Monocytes Relative: 3.8 %
Neutro Abs: 5232 cells/uL (ref 1500–7800)
Neutrophils Relative %: 46.3 %
Platelets: 261 10*3/uL (ref 140–400)
RBC: 4.68 10*6/uL (ref 3.80–5.10)
RDW: 12 % (ref 11.0–15.0)
Total Lymphocyte: 38 %
WBC: 11.3 10*3/uL — ABNORMAL HIGH (ref 3.8–10.8)

## 2019-06-21 LAB — TSH: TSH: 1.38 mIU/L

## 2019-06-23 ENCOUNTER — Telehealth: Payer: Self-pay | Admitting: Family

## 2019-06-23 ENCOUNTER — Ambulatory Visit (HOSPITAL_BASED_OUTPATIENT_CLINIC_OR_DEPARTMENT_OTHER): Admit: 2019-06-23 | Payer: No Typology Code available for payment source | Admitting: Obstetrics & Gynecology

## 2019-06-23 ENCOUNTER — Encounter (HOSPITAL_BASED_OUTPATIENT_CLINIC_OR_DEPARTMENT_OTHER): Payer: Self-pay

## 2019-06-23 DIAGNOSIS — E559 Vitamin D deficiency, unspecified: Secondary | ICD-10-CM

## 2019-06-23 DIAGNOSIS — E785 Hyperlipidemia, unspecified: Secondary | ICD-10-CM | POA: Insufficient documentation

## 2019-06-23 DIAGNOSIS — E782 Mixed hyperlipidemia: Secondary | ICD-10-CM | POA: Insufficient documentation

## 2019-06-23 SURGERY — LIGATION, FALLOPIAN TUBE, LAPAROSCOPIC
Anesthesia: General | Laterality: Bilateral

## 2019-06-23 NOTE — Telephone Encounter (Signed)
Lvm  for patient to call back about results. 

## 2019-06-23 NOTE — Telephone Encounter (Signed)
Please advise pt that her vitamin D is low. I would like her to stop otc vitamin D and begin 81829 iu of vitamin D once weekly. Repeat vit D level in 3 months, dx vit D deficiency.  Cholesterol and triglycerides are elevated. Work on low fat diet and limit white carbs/sugar in diet.

## 2019-06-24 NOTE — Telephone Encounter (Signed)
Looks like patient review results on her My Chart. Message sent to patient with results and provider's advise using MyChart.

## 2019-08-22 ENCOUNTER — Encounter (HOSPITAL_BASED_OUTPATIENT_CLINIC_OR_DEPARTMENT_OTHER): Payer: Self-pay | Admitting: Obstetrics & Gynecology

## 2019-08-22 ENCOUNTER — Other Ambulatory Visit: Payer: Self-pay

## 2019-08-22 ENCOUNTER — Other Ambulatory Visit: Payer: Self-pay | Admitting: Obstetrics & Gynecology

## 2019-08-22 NOTE — Progress Notes (Signed)
Spoke w/ via phone for pre-op interview---patient Lab needs dos----cbc, urine preg              COVID test ------08-25-2019 at 1455 Arrive at -------600 am 08-28-2019 NPO after ------midnight Medications to take morning of surgery -----none Diabetic medication -----n/a Patient Special Instructions -----none Pre-Op special Istructions -----none Patient verbalized understanding of instructions that were given at this phone interview. Patient denies shortness of breath, chest pain, fever, cough a this phone interview.

## 2019-08-25 ENCOUNTER — Other Ambulatory Visit (HOSPITAL_COMMUNITY)
Admission: RE | Admit: 2019-08-25 | Discharge: 2019-08-25 | Disposition: A | Discharge status: Home / Self Care | Payer: No Typology Code available for payment source | Source: Ambulatory Visit | Attending: Obstetrics & Gynecology | Admitting: Obstetrics & Gynecology

## 2019-08-25 DIAGNOSIS — Z87891 Personal history of nicotine dependence: Secondary | ICD-10-CM | POA: Diagnosis not present

## 2019-08-25 DIAGNOSIS — Z30432 Encounter for removal of intrauterine contraceptive device: Secondary | ICD-10-CM | POA: Diagnosis not present

## 2019-08-25 DIAGNOSIS — Z20822 Contact with and (suspected) exposure to covid-19: Secondary | ICD-10-CM | POA: Diagnosis not present

## 2019-08-25 DIAGNOSIS — N838 Other noninflammatory disorders of ovary, fallopian tube and broad ligament: Secondary | ICD-10-CM | POA: Diagnosis not present

## 2019-08-25 DIAGNOSIS — Z01812 Encounter for preprocedural laboratory examination: Secondary | ICD-10-CM | POA: Insufficient documentation

## 2019-08-25 DIAGNOSIS — Z302 Encounter for sterilization: Secondary | ICD-10-CM | POA: Diagnosis not present

## 2019-08-25 LAB — SARS CORONAVIRUS 2 (TAT 6-24 HRS): SARS Coronavirus 2: NEGATIVE

## 2019-08-27 NOTE — H&P (Addendum)
Kristin Gonzalez is an 34 y.o. female [phone]. Kyleena IUD placed last month, but here desiring permanent sterilization by tubal ligation.  Pt had scheduled sur in Jan'21 but cancelled since she was not sure of anything permanent. IUD was placed for LARC but is having cramping/ spotting and though not affecting lifestyle, she is certain she rather have permanent sterility. Couple declined vasectomy option.  SurgHx- C/s x3 NKDAs MedHx neg ROS neg. Past smoker.  GynHx- 2 TABs, then 3 term pregs, all C/sections. Nl Paps, no breast issues.  Patient's last menstrual period was 07/22/2019.    Past Medical History:  Diagnosis Date  . Medical history non-contributory   . Postpartum care following cesarean delivery (8/28) 01/30/2013  . Postpartum care following cesarean delivery (9/23) 02/26/2015  . Postpartum care following cesarean delivery (9/23) 02/26/2015  . Vitamin D deficiency     Past Surgical History:  Procedure Laterality Date  . CESAREAN SECTION N/A 01/30/2013   Procedure: CESAREAN SECTION;  Surgeon: Robley Fries, MD;  Location: [facility];  Service: Obstetrics;  Laterality: N/A;  . CESAREAN SECTION N/A 02/26/2015   Procedure: Repeat CESAREAN SECTION;  Surgeon: Maxie Better, MD;  Location: [facility];  Service: Obstetrics;  Laterality: N/A;  . CESAREAN SECTION N/A 10/24/2017   Procedure: Repeat CESAREAN SECTION;  Surgeon: Maxie Better, MD;  Location: [facility];  Service: Obstetrics;  Laterality: N/A;  EDD: 10/30/17  . WISDOM TOOTH EXTRACTION  2018    Family History  Problem Relation Age of Onset  . Hyperlipidemia Mother   . Hypertension Father   . Heart block Maternal Grandmother     Social History:  reports that she has quit smoking. Her smoking use included cigarettes. She has a 5.00 pack-year smoking history. She has never used smokeless tobacco. She reports that she does not drink alcohol or use drugs.  Allergies: No Known Allergies  No medications prior to  admission.   Review of Systems neg  Height 5' (1.524 m), weight 54.4 kg, last menstrual period 07/22/2019, unknown if currently breastfeeding. Physical Exam A&O x 3, no acute distress. Pleasant HEENT neg, no thyromegaly Lungs CTA bilat CV RRR, S1S2 normal Abdo soft, non tender, non acute Extr no edema/ tenderness Pelvic Nl uterus, cx and adnexa  Assessment/Plan: 34 yo [id number], here for laparoscopic female sterilization by salpingectomy.  Risks/complications of surgery reviewed incl infection, bleeding, damage to internal organs including bladder, bowels, ureters, blood vessels, other risks from anesthesia, VTE and delayed complications of any surgery, complications in future surgery reviewed. Also discussed irreversibility, failure, ectopic risk, risk of regret. She voiced understanding and given informed consent   Robley Fries 08/27/2019, 9:58 PM   Addendum-  Pt seen this morning. All questions addressed. Wants her belly button piercing cut ie use that site for L/scope, will try. Desires salpingectomy, d/w pt Filshie clips in case she ever changed her mind for reversal option, she declined. Wants IUD removed, will attempt. Reviewed risks/complications. Mainly worried about GA, spoke with anesthesia team --V.ModyMD

## 2019-08-27 NOTE — Anesthesia Preprocedure Evaluation (Addendum)
Anesthesia Evaluation  Patient identified by MRN, date of birth, ID band Patient awake    Reviewed: Allergy & Precautions, NPO status , Patient's Chart, lab work & pertinent test results  History of Anesthesia Complications Negative for: history of anesthetic complications  Airway Mallampati: II  TM Distance: >3 FB Neck ROM: Full    Dental no notable dental hx. (+) Dental Advisory Given   Pulmonary former smoker,    Pulmonary exam normal        Cardiovascular Exercise Tolerance: Good Normal cardiovascular exam     Neuro/Psych negative neurological ROS  negative psych ROS   GI/Hepatic negative GI ROS, Neg liver ROS,   Endo/Other  negative endocrine ROS  Renal/GU negative Renal ROS     Musculoskeletal negative musculoskeletal ROS (+)   Abdominal   Peds  Hematology negative hematology ROS (+)   Anesthesia Other Findings Day of surgery medications reviewed with the patient.  Reproductive/Obstetrics                            Lab Results  Component Value Date   WBC 11.3 (H) 06/20/2019   HGB 13.5 06/20/2019   HCT 40.0 06/20/2019   MCV 85.5 06/20/2019   PLT 261 06/20/2019   Lab Results  Component Value Date   CREATININE 0.51 06/20/2019   BUN 11 06/20/2019   NA 136 06/20/2019   K 4.5 06/20/2019   CL 101 06/20/2019   CO2 24 06/20/2019    Anesthesia Physical  Anesthesia Plan  ASA: I  Anesthesia Plan: General   Post-op Pain Management:    Induction: Intravenous  PONV Risk Score and Plan: 4 or greater and Ondansetron, Dexamethasone, Scopolamine patch - Pre-op and Midazolam  Airway Management Planned: Oral ETT  Additional Equipment: None  Intra-op Plan:   Post-operative Plan: Extubation in OR  Informed Consent: I have reviewed the patients History and Physical, chart, labs and discussed the procedure including the risks, benefits and alternatives for the proposed  anesthesia with the patient or authorized representative who has indicated his/her understanding and acceptance.     Dental advisory given  Plan Discussed with: CRNA and Anesthesiologist  Anesthesia Plan Comments:        Anesthesia Quick Evaluation

## 2019-08-28 ENCOUNTER — Other Ambulatory Visit: Payer: Self-pay

## 2019-08-28 ENCOUNTER — Ambulatory Visit (HOSPITAL_BASED_OUTPATIENT_CLINIC_OR_DEPARTMENT_OTHER)
Admission: RE | Admit: 2019-08-28 | Discharge: 2019-08-28 | Disposition: A | Discharge status: Home / Self Care | Payer: No Typology Code available for payment source | Attending: Obstetrics & Gynecology | Admitting: Obstetrics & Gynecology

## 2019-08-28 ENCOUNTER — Encounter (HOSPITAL_BASED_OUTPATIENT_CLINIC_OR_DEPARTMENT_OTHER)
Admission: RE | Disposition: A | Discharge status: Home / Self Care | Payer: Self-pay | Source: Home / Self Care | Attending: Obstetrics & Gynecology

## 2019-08-28 ENCOUNTER — Encounter (HOSPITAL_BASED_OUTPATIENT_CLINIC_OR_DEPARTMENT_OTHER): Payer: Self-pay | Admitting: Obstetrics & Gynecology

## 2019-08-28 ENCOUNTER — Ambulatory Visit (HOSPITAL_BASED_OUTPATIENT_CLINIC_OR_DEPARTMENT_OTHER): Payer: No Typology Code available for payment source | Admitting: Anesthesiology

## 2019-08-28 DIAGNOSIS — Z30432 Encounter for removal of intrauterine contraceptive device: Secondary | ICD-10-CM | POA: Insufficient documentation

## 2019-08-28 DIAGNOSIS — Z302 Encounter for sterilization: Secondary | ICD-10-CM | POA: Insufficient documentation

## 2019-08-28 DIAGNOSIS — Z20822 Contact with and (suspected) exposure to covid-19: Secondary | ICD-10-CM | POA: Insufficient documentation

## 2019-08-28 DIAGNOSIS — N838 Other noninflammatory disorders of ovary, fallopian tube and broad ligament: Secondary | ICD-10-CM | POA: Insufficient documentation

## 2019-08-28 DIAGNOSIS — Z87891 Personal history of nicotine dependence: Secondary | ICD-10-CM | POA: Insufficient documentation

## 2019-08-28 HISTORY — PX: IUD REMOVAL: SHX5392

## 2019-08-28 HISTORY — PX: LAPAROSCOPIC TUBAL LIGATION: SHX1937

## 2019-08-28 LAB — POCT PREGNANCY, URINE: Preg Test, Ur: NEGATIVE

## 2019-08-28 LAB — CBC
HCT: 41.4 % (ref 36.0–46.0)
Hemoglobin: 13.7 g/dL (ref 12.0–15.0)
MCH: 28.7 pg (ref 26.0–34.0)
MCHC: 33.1 g/dL (ref 30.0–36.0)
MCV: 86.6 fL (ref 80.0–100.0)
Platelets: 211 10*3/uL (ref 150–400)
RBC: 4.78 MIL/uL (ref 3.87–5.11)
RDW: 12.2 % (ref 11.5–15.5)
WBC: 10.6 10*3/uL — ABNORMAL HIGH (ref 4.0–10.5)
nRBC: 0 % (ref 0.0–0.2)

## 2019-08-28 SURGERY — LIGATION, FALLOPIAN TUBE, LAPAROSCOPIC
Anesthesia: General | Site: Uterus

## 2019-08-28 MED ORDER — CELECOXIB 200 MG PO CAPS
ORAL_CAPSULE | ORAL | Status: AC
  Filled 2019-08-28: qty 1

## 2019-08-28 MED ORDER — IBUPROFEN 200 MG PO TABS: 600.0000 mg | ORAL_TABLET | Freq: Four times a day (QID) | ORAL | 0 refills | Status: DC | PRN

## 2019-08-28 MED ORDER — MIDAZOLAM HCL 5 MG/5ML IJ SOLN
INTRAMUSCULAR | Status: DC | PRN
  Administered 2019-08-28: 2 mg via INTRAVENOUS

## 2019-08-28 MED ORDER — SUGAMMADEX SODIUM 200 MG/2ML IV SOLN
INTRAVENOUS | Status: DC | PRN
  Administered 2019-08-28: 125 mg via INTRAVENOUS

## 2019-08-28 MED ORDER — LIDOCAINE 2% (20 MG/ML) 5 ML SYRINGE
INTRAMUSCULAR | Status: AC
  Filled 2019-08-28: qty 5

## 2019-08-28 MED ORDER — FENTANYL CITRATE (PF) 100 MCG/2ML IJ SOLN
INTRAMUSCULAR | Status: DC | PRN
  Administered 2019-08-28 (×2): 50 ug via INTRAVENOUS

## 2019-08-28 MED ORDER — ONDANSETRON HCL 4 MG/2ML IJ SOLN
INTRAMUSCULAR | Status: AC
  Filled 2019-08-28: qty 2

## 2019-08-28 MED ORDER — KETOROLAC TROMETHAMINE 15 MG/ML IJ SOLN
15.0000 mg | Freq: Once | INTRAMUSCULAR | Status: AC
  Administered 2019-08-28: 15 mg via INTRAVENOUS
  Filled 2019-08-28: qty 1

## 2019-08-28 MED ORDER — ROCURONIUM BROMIDE 10 MG/ML (PF) SYRINGE
PREFILLED_SYRINGE | INTRAVENOUS | Status: AC
  Filled 2019-08-28: qty 10

## 2019-08-28 MED ORDER — ACETAMINOPHEN 500 MG PO TABS
1000.0000 mg | ORAL_TABLET | Freq: Once | ORAL | Status: AC
  Administered 2019-08-28: 08:00:00 1000 mg via ORAL
  Filled 2019-08-28: qty 2

## 2019-08-28 MED ORDER — SCOPOLAMINE 1 MG/3DAYS TD PT72
MEDICATED_PATCH | TRANSDERMAL | Status: AC
  Filled 2019-08-28: qty 1

## 2019-08-28 MED ORDER — DEXAMETHASONE SODIUM PHOSPHATE 10 MG/ML IJ SOLN
INTRAMUSCULAR | Status: DC | PRN
  Administered 2019-08-28: 10 mg via INTRAVENOUS

## 2019-08-28 MED ORDER — ACETAMINOPHEN 325 MG PO TABS: 650.0000 mg | ORAL_TABLET | Freq: Four times a day (QID) | ORAL | Status: DC | PRN

## 2019-08-28 MED ORDER — PROMETHAZINE HCL 25 MG/ML IJ SOLN
6.2500 mg | INTRAMUSCULAR | Status: DC | PRN
  Filled 2019-08-28: qty 1

## 2019-08-28 MED ORDER — CELECOXIB 200 MG PO CAPS
200.0000 mg | ORAL_CAPSULE | Freq: Once | ORAL | Status: AC
  Administered 2019-08-28: 08:00:00 200 mg via ORAL
  Filled 2019-08-28: qty 1

## 2019-08-28 MED ORDER — FENTANYL CITRATE (PF) 100 MCG/2ML IJ SOLN
INTRAMUSCULAR | Status: AC
  Filled 2019-08-28: qty 2

## 2019-08-28 MED ORDER — ROCURONIUM BROMIDE 10 MG/ML (PF) SYRINGE
PREFILLED_SYRINGE | INTRAVENOUS | Status: DC | PRN
  Administered 2019-08-28: 40 mg via INTRAVENOUS

## 2019-08-28 MED ORDER — PROPOFOL 10 MG/ML IV BOLUS
INTRAVENOUS | Status: DC | PRN
  Administered 2019-08-28: 180 mg via INTRAVENOUS

## 2019-08-28 MED ORDER — LACTATED RINGERS IV SOLN
INTRAVENOUS | Status: DC
  Filled 2019-08-28: qty 1000

## 2019-08-28 MED ORDER — MIDAZOLAM HCL 2 MG/2ML IJ SOLN
INTRAMUSCULAR | Status: AC
  Filled 2019-08-28: qty 2

## 2019-08-28 MED ORDER — 0.9 % SODIUM CHLORIDE (POUR BTL) OPTIME
TOPICAL | Status: DC | PRN
  Administered 2019-08-28: 500 mL

## 2019-08-28 MED ORDER — FENTANYL CITRATE (PF) 100 MCG/2ML IJ SOLN
25.0000 ug | INTRAMUSCULAR | Status: DC | PRN
  Administered 2019-08-28: 25 ug via INTRAVENOUS
  Filled 2019-08-28: qty 1

## 2019-08-28 MED ORDER — BUPIVACAINE HCL (PF) 0.25 % IJ SOLN
INTRAMUSCULAR | Status: DC | PRN
  Administered 2019-08-28: 30 mL

## 2019-08-28 MED ORDER — LIDOCAINE 2% (20 MG/ML) 5 ML SYRINGE
INTRAMUSCULAR | Status: DC | PRN
  Administered 2019-08-28: 60 mg via INTRAVENOUS

## 2019-08-28 MED ORDER — ACETAMINOPHEN 500 MG PO TABS
ORAL_TABLET | ORAL | Status: AC
  Filled 2019-08-28: qty 2

## 2019-08-28 MED ORDER — DEXAMETHASONE SODIUM PHOSPHATE 10 MG/ML IJ SOLN
INTRAMUSCULAR | Status: AC
  Filled 2019-08-28: qty 1

## 2019-08-28 MED ORDER — PROPOFOL 10 MG/ML IV BOLUS
INTRAVENOUS | Status: AC
  Filled 2019-08-28: qty 20

## 2019-08-28 MED ORDER — ONDANSETRON HCL 4 MG/2ML IJ SOLN
INTRAMUSCULAR | Status: DC | PRN
  Administered 2019-08-28: 4 mg via INTRAVENOUS

## 2019-08-28 MED ORDER — SCOPOLAMINE 1 MG/3DAYS TD PT72
1.0000 | MEDICATED_PATCH | TRANSDERMAL | Status: DC
  Administered 2019-08-28: 08:00:00 1.5 mg via TRANSDERMAL
  Filled 2019-08-28: qty 1

## 2019-08-28 MED ORDER — KETOROLAC TROMETHAMINE 30 MG/ML IJ SOLN
INTRAMUSCULAR | Status: AC
  Filled 2019-08-28: qty 1

## 2019-08-28 SURGICAL SUPPLY — 26 items
ADH SKN CLS APL DERMABOND .7 (GAUZE/BANDAGES/DRESSINGS) ×2
CATH ROBINSON RED A/P 16FR (CATHETERS) ×4 IMPLANT
COVER MAYO STAND STRL (DRAPES) ×4 IMPLANT
COVER WAND RF STERILE (DRAPES) ×4 IMPLANT
DERMABOND ADVANCED (GAUZE/BANDAGES/DRESSINGS) ×2
DERMABOND ADVANCED .7 DNX12 (GAUZE/BANDAGES/DRESSINGS) ×2 IMPLANT
DRSG OPSITE POSTOP 3X4 (GAUZE/BANDAGES/DRESSINGS) IMPLANT
DURAPREP 26ML APPLICATOR (WOUND CARE) ×4 IMPLANT
GAUZE 4X4 16PLY RFD (DISPOSABLE) ×4 IMPLANT
GLOVE BIO SURGEON STRL SZ7 (GLOVE) ×4 IMPLANT
GLOVE BIOGEL PI IND STRL 7.0 (GLOVE) ×6 IMPLANT
GLOVE BIOGEL PI INDICATOR 7.0 (GLOVE) ×6
GOWN STRL REUS W/TWL LRG LVL3 (GOWN DISPOSABLE) ×4 IMPLANT
LIGASURE VESSEL 5MM BLUNT TIP (ELECTROSURGICAL) ×4 IMPLANT
PACK LAPAROSCOPY BASIN (CUSTOM PROCEDURE TRAY) ×4 IMPLANT
PACK TRENDGUARD 450 HYBRID PRO (MISCELLANEOUS) IMPLANT
PAD OB MATERNITY 4.3X12.25 (PERSONAL CARE ITEMS) ×4 IMPLANT
PROTECTOR NERVE ULNAR (MISCELLANEOUS) ×8 IMPLANT
SET TUBE SMOKE EVAC HIGH FLOW (TUBING) ×4 IMPLANT
SUT VICRYL 0 UR6 27IN ABS (SUTURE) ×4 IMPLANT
SUT VICRYL 4-0 PS2 18IN ABS (SUTURE) ×4 IMPLANT
TOWEL OR 17X26 10 PK STRL BLUE (TOWEL DISPOSABLE) ×4 IMPLANT
TRENDGUARD 450 HYBRID PRO PACK (MISCELLANEOUS)
TROCAR BALLN 12MMX100 BLUNT (TROCAR) ×4 IMPLANT
TROCAR BLADELESS OPT 5 100 (ENDOMECHANICALS) ×4 IMPLANT
WARMER LAPAROSCOPE (MISCELLANEOUS) ×4 IMPLANT

## 2019-08-28 NOTE — Discharge Instructions (Addendum)
Laparoscopic Tubal Removal Laparoscopic tubal ligation is a procedure to close the fallopian tubes. This is done so that you cannot get pregnant. When the fallopian tubes are closed, the eggs that your ovaries release cannot enter the uterus, and sperm cannot reach the released eggs. You should not have this procedure if you want to get pregnant someday or if you are unsure about having more children. Tell a health care provider about:  Any allergies you have.  All medicines you are taking, including vitamins, herbs, eye drops, creams, and over-the-counter medicines.  Any problems you or family members have had with anesthetic medicines.  Any blood disorders you have.  Any surgeries you have had.  Any medical conditions you have.  Whether you are pregnant or may be pregnant.  Any past pregnancies. What are the risks? Generally, this is a safe procedure. However, problems may occur, including:  Infection.  Bleeding.  Injury to other organs in the abdomen.  Side effects from anesthetic medicines.  Failure of the procedure. This procedure can increase your risk of a kind of pregnancy in which a fertilized egg attaches to the outside of the uterus (ectopic pregnancy). What happens before the procedure? Medicines  Ask your health care provider about: ? Changing or stopping your regular medicines. This is especially important if you are taking diabetes medicines or blood thinners. ? Taking medicines such as aspirin and ibuprofen. These medicines can thin your blood. Do not take these medicines unless your health care provider tells you to take them. ? Taking over-the-counter medicines, vitamins, herbs, and supplements. Staying hydrated  Follow instructions from your health care provider about hydration, which may include: ? Up to 2 hours before the procedure - you may continue to drink clear liquids, such as water, clear fruit juice, black coffee, and plain tea. Eating and  drinking  Follow instructions from your health care provider about eating and drinking, which may include: ? 8 hours before the procedure - stop eating heavy meals or foods, such as meat, fried foods, or fatty foods. ? 6 hours before the procedure - stop eating light meals or foods, such as toast or cereal. ? 6 hours before the procedure - stop drinking milk or drinks that contain milk. ? 2 hours before the procedure - stop drinking clear liquids. General instructions  Do not use any products that contain nicotine or tobacco for at least 4 weeks before the procedure. These products include cigarettes, e-cigarettes, and chewing tobacco. If you need help quitting, ask your health care provider.  Plan to have someone take you home from the hospital.  If you will be going home right after the procedure, plan to have someone with you for 24 hours.  Ask your health care provider: ? How your surgery site will be marked. ? What steps will be taken to help prevent infection. These may include:  Removing hair at the surgery site.  Washing skin with a germ-killing soap.  Taking antibiotic medicine. What happens during the procedure?      An IV will be inserted into one of your veins.  You will be given one or more of the following: ? A medicine to help you relax (sedative). ? A medicine to numb the area (local anesthetic). ? A medicine to make you fall asleep (general anesthetic). ? A medicine that is injected into an area of your body to numb everything below the injection site (regional anesthetic).  Your bladder may be emptied with a small tube (  catheter).  If you have been given a general anesthetic, a tube will be put down your throat to help you breathe.  Two small incisions will be made in your lower abdomen and near your belly button.  Your abdomen will be inflated with a gas. This will let the surgeon see better and will give the surgeon room to work.  A thin, lighted tube  (laparoscope) with a camera attached will be inserted into your abdomen through one of the incisions. Small instruments will be inserted through the other incision.  The fallopian tubes will be tied off, burned (cauterized), or blocked with a clip, ring, or clamp. A small portion in the center of each fallopian tube may be removed.  The gas will be released from the abdomen.  The incisions will be closed with stitches (sutures).  A bandage (dressing) will be placed over the incisions. The procedure may vary among health care providers and hospitals. What happens after the procedure?  Your blood pressure, heart rate, breathing rate, and blood oxygen level will be monitored until you leave the hospital.  You will be given medicine to help with pain, nausea, and vomiting as needed. Summary  Laparoscopic tubal ligation is a procedure that is done so that you cannot get pregnant.  You should not have this procedure if you want to get pregnant someday or if you are unsure about having more children.  The procedure is done using a thin, lighted tube (laparoscope) with a camera attached that will be inserted into your abdomen through an incision.  Follow instructions from your health care provider about eating and drinking before the procedure. This information is not intended to replace advice given to you by your health care provider. Make sure you discuss any questions you have with your health care provider. Document Revised: 10/29/2018 Document Reviewed: 04/16/2018 Elsevier Patient Education  2020 [employer].    Post Anesthesia Home Care Instructions  Activity: Get plenty of rest for the remainder of the day. A responsible individual must stay with you for 24 hours following the procedure.  For the next 24 hours, DO NOT: -Drive a car -Advertising copywriter -Drink alcoholic beverages -Take any medication unless instructed by your physician -Make any legal decisions or sign important  papers.  Meals: Start with liquid foods such as gelatin or soup. Progress to regular foods as tolerated. Avoid greasy, spicy, heavy foods. If nausea and/or vomiting occur, drink only clear liquids until the nausea and/or vomiting subsides. Call your physician if vomiting continues.  Special Instructions/Symptoms: Your throat may feel dry or sore from the anesthesia or the breathing tube placed in your throat during surgery. If this causes discomfort, gargle with warm salt water. The discomfort should disappear within 24 hours.  If you had a scopolamine patch placed behind your ear for the management of post- operative nausea and/or vomiting:  1. The medication in the patch is effective for 72 hours, after which it should be removed.  Wrap patch in a tissue and discard in the trash. Wash hands thoroughly with soap and water. 2. You may remove the patch earlier than 72 hours if you experience unpleasant side effects which may include dry mouth, dizziness or visual disturbances. 3. Avoid touching the patch. Wash your hands with soap and water after contact with the patch.    DISCHARGE INSTRUCTIONS: Laparoscopy  The following instructions have been prepared to help you care for yourself upon your return home today.  Wound care:  Do not  get the incision wet for the first 24 hours. The incision should be kept clean and dry.  The Band-Aids or dressings may be removed the day after surgery.  Should the incision become sore, red, and swollen after the first week, check with your doctor.  Personal hygiene:  Shower the day after your procedure.  Activity and limitations:  Do NOT drive or operate any equipment today.  Do NOT lift anything more than 20 pounds for 4 weeks after surgery.  Do NOT rest in bed all day.  Walking is encouraged. Walk each day, starting slowly with 5-minute walks 3 or 4 times a day. Slowly increase the length of your walks.  Walk up and down stairs slowly.  Do  NOT do strenuous activities, such as golfing, playing tennis, bowling, running, biking, weight lifting, gardening, mowing, or vacuuming for 2-4 weeks. Ask your doctor when it is okay to start.  Diet: Eat a light meal as desired this evening. You may resume your usual diet tomorrow.  Return to work: This is dependent on the type of work you do. For the most part you can return to a desk job within a week of surgery. If you are more active at work, please discuss this with your doctor.  What to expect after your surgery: You may have a slight burning sensation when you urinate on the first day. You may have a very small amount of blood in the urine. Expect to have a small amount of vaginal discharge/light bleeding for 1-2 weeks. It is not unusual to have abdominal soreness and bruising for up to 2 weeks. You may be tired and need more rest for about 1 week. You may experience shoulder pain for 24-72 hours. Lying flat in bed may relieve it.  Call your doctor for any of the following:  Develop a fever of 100.4 or greater  Inability to urinate 6 hours after discharge from hospital  Severe pain not relieved by pain medications  Persistent of heavy bleeding at incision site  Redness or swelling around incision site after a week  Increasing nausea or vomiting

## 2019-08-28 NOTE — Anesthesia Postprocedure Evaluation (Signed)
Anesthesia Post Note  Patient: Shanelle Chittum  Procedure(s) Performed: LAPAROSCOPIC TUBAL LIGATION BY SALPINGECTOMY (Bilateral Abdomen) INTRAUTERINE DEVICE (IUD) REMOVAL (N/A Uterus)     Patient location during evaluation: PACU Anesthesia Type: General Level of consciousness: sedated Pain management: pain level controlled Vital Signs Assessment: post-procedure vital signs reviewed and stable Respiratory status: spontaneous breathing and respiratory function stable Cardiovascular status: stable Postop Assessment: no apparent nausea or vomiting Anesthetic complications: no    Last Vitals:  Vitals:   08/28/19 0945 08/28/19 1000  BP: 102/69 102/73  Pulse: (!) 49 (!) 50  Resp: 18 20  Temp:    SpO2: 98% 100%    Last Pain:  Vitals:   08/28/19 0945  TempSrc:   PainSc: 3                  Cinthia Rodden DANIEL

## 2019-08-28 NOTE — Op Note (Signed)
Preoperative diagnosis: Multiparity, permanent sterilization desired Postoperative diagnosis: Same Procedure: Laparoscopic bilateral tubal sterilization by bilateral salpingectomy                     IUD removal  Surgeon: Dr Shea Evans, MD Assistants: none Anesthesia Gen. Endotracheal IV fluids LR 800 cc EBL minimal Urine clear (straight cath pre-op) 25 cc Complications none Disposition PACU and home Specimens : bilateral fallopian tubes   Procedure Patient is 34 yo, [id number], who desired permanent sterilization via tubal ligation. All options of contraception and sterilization were reviewed including IUDs as well as vasectomy. Patient declined other options. Risk and complications of surgery including infection, bleeding, damage to internal organs, other complications including pneumonia, VTE were reviewed. Also discussed irreversibility as well as failure and risk of ectopic pregnancy. Patient voiced understanding. Informed written consent was obtained.  Patient was brought to the operating room with IV running. Timeout was carried out. Antibiotic was not indicated. She underwent general anesthesia without difficulty and was given dorsal lithotomy position. Examination under anesthesia revealed anteverted 8 week size uterus. Parts prepped and draped in standard fashion. Bladder was emptied with straight catheter with clear urine. Speculum was placed anterior lip of cervix was grasped with tenaculum, Hulka manipulator inserted and secured.  Gloves, gown were changed attention was focused on the abdomen. A 10 mm vertical incision was made at the upper edge of umbilicus after injecting 0.25% Marcaine. Incision was carried down to the fascia was incised, posterior sheath grasped, incised and peritoneal entry was confirmed. Stay suture of 0 Vicryl was taken on the fascia and Hassan cannula was introduced. Insufflation was begun with CO2. A 0 laparoscope was introduced. Internal organs appear normal,  no adhesions from prior 3 c-sections noted.  No bleeding was noted. Patient was given Trendelenburg position.  Two ports 5 mm each placed in lower quadrants under vision after injecting Marcaine. Uterus elevated and tubes exposed, no adhesions noted. Left and right salpingectomy performed using Ligasure to cauterize and cut mesosalpinx while lifting tubes with [employer] grasper. Tubes handed off for pathology. Hemostasis noted. Ovaries and uterus normal. All instruments removed. Pneumoperitoneum released. More local anesthetic injected in incisions (total 30 cc used) and fascia closed with 0-vicryl stay sutures and skin with 4-0 vicryl, Dermabond applied.  Uterine manipulator removed. Speculum placed. IUD removed. Hemostasis was excellent. Instruments were removed.  All counts were correct x2 patient was reversed from general anesthesia extubated and brought out to the recovery room in stable condition. Plan is to discharge home from recovery room.  Surgical findings were discussed with patient's family. Followup with Dr. Juliene Pina in office in 2 weeks.  I performed this surgery. Shea Evans, MD

## 2019-08-28 NOTE — Anesthesia Procedure Notes (Signed)
Procedure Name: Intubation Date/Time: 08/28/2019 8:13 AM Performed by: Lucious Zou D, CRNA Pre-anesthesia Checklist: Patient identified, Emergency Drugs available, Suction available and Patient being monitored Patient Re-evaluated:Patient Re-evaluated prior to induction Oxygen Delivery Method: Circle system utilized Preoxygenation: Pre-oxygenation with 100% oxygen Induction Type: IV induction Ventilation: Mask ventilation without difficulty Laryngoscope Size: Mac and 3 Grade View: Grade I Tube type: Oral Tube size: 7.0 mm Number of attempts: 1 Airway Equipment and Method: Stylet Placement Confirmation: ETT inserted through vocal cords under direct vision,  positive ETCO2 and breath sounds checked- equal and bilateral Secured at: 20 cm Tube secured with: Tape Dental Injury: Teeth and Oropharynx as per pre-operative assessment

## 2019-08-28 NOTE — Transfer of Care (Signed)
Immediate Anesthesia Transfer of Care Note  Patient: Kristin Gonzalez  Procedure(s) Performed: LAPAROSCOPIC TUBAL LIGATION BY SALPINGECTOMY (Bilateral Abdomen) INTRAUTERINE DEVICE (IUD) REMOVAL (N/A Uterus)  Patient Location: PACU  Anesthesia Type:General  Level of Consciousness: awake, alert  and oriented  Airway & Oxygen Therapy: Patient Spontanous Breathing and Patient connected to nasal cannula oxygen  Post-op Assessment: Report given to RN and Post -op Vital signs reviewed and stable  Post vital signs: Reviewed and stable  Last Vitals:  Vitals Value Taken Time  BP 109/71 08/28/19 0909  Temp    Pulse 77 08/28/19 0911  Resp 24 08/28/19 0911  SpO2 93 % 08/28/19 0911  Vitals shown include unvalidated device data.  Last Pain:  Vitals:   08/28/19 0705  TempSrc: Oral         Complications: No apparent anesthesia complications

## 2019-08-29 LAB — SURGICAL PATHOLOGY

## 2019-09-03 ENCOUNTER — Telehealth: Payer: Self-pay

## 2019-09-03 MED ORDER — BUPROPION HCL ER (SR) 100 MG PO TB12: 100.0000 mg | ORAL_TABLET | Freq: Two times a day (BID) | ORAL | 0 refills | Status: DC

## 2019-09-03 NOTE — Telephone Encounter (Signed)
Patient called in needing to speak with NP Peggyann Juba nurse please give the patient a call at [phone]

## 2019-09-03 NOTE — Telephone Encounter (Signed)
Notes increased stress, increased irritability.  Had an Rx for wellbutrin from her previous provider. Started 2 days ago.  Tolerating without difficulty.  Smoking 1 PPD, requests refill.  I sent a 30 day supply. Advised pt to schedule a virtual visit with me in 1 month for follow up. Pt verbalizes understanding.

## 2019-09-03 NOTE — Telephone Encounter (Signed)
Routed to [employer].

## 2019-09-08 NOTE — Telephone Encounter (Signed)
Patient scheduled for 10-03-2019

## 2019-10-03 ENCOUNTER — Telehealth (INDEPENDENT_AMBULATORY_CARE_PROVIDER_SITE_OTHER): Payer: No Typology Code available for payment source | Admitting: Family

## 2019-10-03 ENCOUNTER — Other Ambulatory Visit: Payer: Self-pay

## 2019-10-03 DIAGNOSIS — F418 Other specified anxiety disorders: Secondary | ICD-10-CM

## 2019-10-03 DIAGNOSIS — Z720 Tobacco use: Secondary | ICD-10-CM

## 2019-10-03 MED ORDER — ESCITALOPRAM OXALATE 5 MG PO TABS: 5.0000 mg | ORAL_TABLET | Freq: Every day | ORAL | 1 refills | Status: DC

## 2019-10-03 MED ORDER — BUPROPION HCL ER (XL) 300 MG PO TB24: 300.0000 mg | ORAL_TABLET | Freq: Every day | ORAL | 1 refills | Status: DC

## 2019-10-03 MED FILL — buPROPion HCL ER (XL) 300 M: 300 | 30 days supply | Qty: 30 | Fill #0

## 2019-10-03 MED FILL — ESCITALOPRAM 5 MG TABLET: 5 | 30 days supply | Qty: 30 | Fill #0

## 2019-10-03 NOTE — Progress Notes (Signed)
Virtual Visit via Telephone Note  I connected with Kristin Gonzalez on 10/03/19 at  4:20 PM EDT by telephone and verified that I am speaking with the correct person using two identifiers.  Location: Patient: home Provider: work   I discussed the limitations, risks, security and privacy concerns of performing an evaluation and management service by telephone and the availability of in person appointments. I also discussed with the patient that there may be a patient responsible charge related to this service. The patient expressed understanding and agreed to proceed.   History of Present Illness:  Patient is a 34 yr old female who presents today for follow up. Last visit we added Wellbutrin 100 mg twice daily to help with some irritability as well as smoking cessation.  She reports that the first week or 2 that she was on Wellbutrin she noted a significant decrease in her cigarette cravings. Now smoking 5-6. However after the first couple of weeks she felt like it was less effective.  From a mood standpoint, since starting Wellbutrin she notes less impulsivity, yelling less at her children. Can control herself better.  She is fasting for ramadan. Still feeling anxious.  Reports that she is a "control freak."     Observations/Objective:   Gen: Awake, alert, no acute distress Resp: Breathing is even and non-labored Psych: calm/pleasant demeanor Neuro: Alert and Oriented x 3, speech is clear.   Assessment and Plan:  Anxiety/Depression- Depression symptoms are stable, but still c/o anxiety. Will add lexapro 5mg  once daily.  Tobacco abuse- improving but still smoking 1/2 PPD. Will increase wellbutrin from 100mg  bid to 300mg  xl once daily. She will increase wellbutrin for 1 week prior to adding the lexapro.  Follow Up Instructions:    I discussed the assessment and treatment plan with the patient. The patient was provided an opportunity to ask questions and all were answered. The patient agreed  with the plan and demonstrated an understanding of the instructions.   The patient was advised to call back or seek an in-person evaluation if the symptoms worsen or if the condition fails to improve as anticipated.  I provided 15 minutes of non-face-to-face time during this encounter.   , NP

## 2019-10-21 ENCOUNTER — Telehealth: Payer: Self-pay | Admitting: Family

## 2019-10-21 NOTE — Telephone Encounter (Signed)
Caller: Levern  Phone number: [phone]  Patient states she was seeing by Efraim Kaufmann couple weeks ago and she prescribes her Lexapro. Patient states she took the medicine for a couple of days and notice that she would excessively yawn. Patient would like to know if there is something else she could take.  Please advise.

## 2019-10-22 NOTE — Telephone Encounter (Signed)
Please advise 

## 2019-10-23 ENCOUNTER — Other Ambulatory Visit: Payer: Self-pay

## 2019-10-23 MED ORDER — FLUOXETINE HCL 10 MG PO TABS: 10.0000 mg | ORAL_TABLET | Freq: Every day | ORAL | 1 refills | Status: DC

## 2019-10-23 MED FILL — FLUOXETINE HCL 10 MG TABS: 10 | 30 days supply | Qty: 30 | Fill #0

## 2019-10-23 NOTE — Telephone Encounter (Signed)
Please advise pt that I sent rx for prozac in place of lexapro to see if she can better tolerate.  The yawning is a common side effect that should improve in a few weeks.

## 2019-10-23 NOTE — Telephone Encounter (Signed)
Patient advised of new rx. She requested rx to be sent to [facility] due to insurance coverage. Rx cancelled at [facility] and sent to [facility].

## 2019-11-07 ENCOUNTER — Other Ambulatory Visit: Payer: Self-pay

## 2019-11-07 ENCOUNTER — Telehealth (INDEPENDENT_AMBULATORY_CARE_PROVIDER_SITE_OTHER): Payer: No Typology Code available for payment source | Admitting: Family

## 2019-11-07 DIAGNOSIS — Z720 Tobacco use: Secondary | ICD-10-CM | POA: Diagnosis not present

## 2019-11-07 DIAGNOSIS — F418 Other specified anxiety disorders: Secondary | ICD-10-CM

## 2019-11-07 NOTE — Progress Notes (Signed)
Virtual Visit via Video Note  I connected with Kristin Gonzalez on 11/07/19 at  1:00 PM EDT and verified that I am speaking with the correct person using two identifiers. The patient attempted to connect via video but had tech issues and the visit was continued by phone.   Location: Patient: home Provider: work   I discussed the limitations of evaluation and management by telemedicine and the availability of in person appointments. The patient expressed understanding and agreed to proceed. Only the patient and myself were present for today's video call.   History of Present Illness:  Patient is a 34 yr old female who presents today for follow up.  Anxiety/Depression- last visit we added lexapro 5mg  once daily for anxiety. She reported that this caused frequent soft stools, insomnia and excessive yawning.  Lexapro was changed to Prozac.  She cut the prozac in half and has been taking 5mg  without side effects except for some delayed orgasms. She is not overly bothered by this.    Tobacco abuse- She is maintained on wellbutrin xl 300mg .  She was down to 3 a day for a few weeks, now <10 a day.  Feels like she might have lost some of her motivation to quit.    Observations/Objective:   Gen: Awake, alert, no acute distress Resp: Breathing is even and non-labored Psych: calm/pleasant demeanor Neuro: Alert and Oriented x 3, + facial symmetry, speech is clear.   Assessment and Plan:  Anxiety/depression- fair control. She wishes to increase prozac to 10mg  once daily.  If side effects become a problem we could consider cymbalta. Plan follow up in 6 weeks.   Tobacco abuse- advised pt to continue wellbutrin for now and try adding nicorette gum as needed in place of cigarettes.  25 minutes spent on today's visit. The majority of this time was spent counseling pt on med management/smoking cessation.    Follow Up Instructions:    I discussed the assessment and treatment plan with the patient. The  patient was provided an opportunity to ask questions and all were answered. The patient agreed with the plan and demonstrated an understanding of the instructions.   The patient was advised to call back or seek an in-person evaluation if the symptoms worsen or if the condition fails to improve as anticipated.  , NP

## 2019-12-12 MED FILL — carBAMazepine 200 MG TABS: 200 | 30 days supply | Qty: 15 | Fill #0

## 2019-12-26 ENCOUNTER — Telehealth (INDEPENDENT_AMBULATORY_CARE_PROVIDER_SITE_OTHER): Payer: No Typology Code available for payment source | Admitting: Family

## 2019-12-26 ENCOUNTER — Other Ambulatory Visit: Payer: Self-pay

## 2019-12-26 ENCOUNTER — Encounter: Payer: Self-pay | Admitting: Family

## 2019-12-26 VITALS — BP 115/70 | HR 80 | Temp 98.9°F

## 2019-12-26 DIAGNOSIS — J069 Acute upper respiratory infection, unspecified: Secondary | ICD-10-CM

## 2019-12-26 DIAGNOSIS — Z720 Tobacco use: Secondary | ICD-10-CM

## 2019-12-26 DIAGNOSIS — F6381 Intermittent explosive disorder: Secondary | ICD-10-CM | POA: Diagnosis not present

## 2019-12-26 DIAGNOSIS — F418 Other specified anxiety disorders: Secondary | ICD-10-CM

## 2019-12-26 MED ORDER — CARBAMAZEPINE 200 MG PO TABS: 200.0000 mg | ORAL_TABLET | Freq: Every day | ORAL | Status: DC

## 2019-12-26 NOTE — Progress Notes (Signed)
Virtual Visit via Video Note  I connected with Kristin Gonzalez on 12/26/19 at  2:00 PM EDT by a video enabled telemedicine application and verified that I am speaking with the correct person using two identifiers.  Location: Patient: home Provider: work   I discussed the limitations of evaluation and management by telemedicine and the availability of in person appointments. The patient expressed understanding and agreed to proceed. Only the patient and myself were present for today's video call.   History of Present Illness:  Patient presents today for follow up.  Anxiety/depression-since her last visit she has established with psychiatry.  Wellbutrin and Prozac were ultimately discontinued.  She did have sexual side effects on Prozac alone.  She was started on Tegretol 100 mg daily.  She feels that this has improved her productivity.  She feels that her irritability is still there however.  Her psychiatrist gave her the diagnosis of intermittent explosive disorder.  Tobacco abuse-she started back smoking again despite previously being on the Wellbutrin.  She is interested in quitting but feels like she needs to get her mental health and better order first.  She is also interested in starting some virtual counseling.  Added flonase, ear itching, nasal drainage, sneezing.  Doesn't feel sick.  "mild cold."       Observations/Objective:   Gen: Awake, alert, no acute distress Resp: Breathing is even and non-labored Psych: calm/pleasant demeanor Neuro: Alert and Oriented x 3, + facial symmetry, speech is clear.   Assessment and Plan:  Depression/anxiety/intermittent explosive disorder-she recently reached out to her psychiatrist and he advised her to increase her Tegretol from 100 mg to 200 mg once daily.  I also gave her the information for low our behavioral medicine to call and schedule an appointment with a counselor.  URI-patient with mild URI symptoms.  She plans to go get a rapid  Covid test today.  Tobacco abuse-we discussed that if it is okay with her psychiatrist once her mood is optimized, we could try a trial of Chantix.  Follow Up Instructions:   I discussed the assessment and treatment plan with the patient. The patient was provided an opportunity to ask questions and all were answered. The patient agreed with the plan and demonstrated an understanding of the instructions.   The patient was advised to call back or seek an in-person evaluation if the symptoms worsen or if the condition fails to improve as anticipated.  Lemont Fillers, NP

## 2020-01-09 MED FILL — carBAMazepine 200 MG TABS: 200 | 30 days supply | Qty: 30 | Fill #0

## 2020-02-03 MED FILL — busPIRone HCL 5 MG TABS: 5 | 30 days supply | Qty: 30 | Fill #0

## 2020-02-03 MED FILL — carBAMazepine 200 MG TABS: 200 | 30 days supply | Qty: 30 | Fill #0

## 2020-02-10 ENCOUNTER — Telehealth: Payer: Self-pay | Admitting: Family

## 2020-02-10 ENCOUNTER — Emergency Department
Admission: RE | Admit: 2020-02-10 | Discharge: 2020-02-10 | Disposition: A | Discharge status: Home / Self Care | Payer: No Typology Code available for payment source | Source: Ambulatory Visit | Attending: Family Medicine | Admitting: Family Medicine

## 2020-02-10 ENCOUNTER — Other Ambulatory Visit: Payer: Self-pay

## 2020-02-10 ENCOUNTER — Telehealth: Payer: Self-pay

## 2020-02-10 VITALS — BP 116/74 | HR 74 | Temp 98.9°F | Resp 15

## 2020-02-10 DIAGNOSIS — M26622 Arthralgia of left temporomandibular joint: Secondary | ICD-10-CM

## 2020-02-10 DIAGNOSIS — H6983 Other specified disorders of Eustachian tube, bilateral: Secondary | ICD-10-CM

## 2020-02-10 DIAGNOSIS — L299 Pruritus, unspecified: Secondary | ICD-10-CM

## 2020-02-10 LAB — POCT RAPID STREP A (OFFICE): Rapid Strep A Screen: NEGATIVE

## 2020-02-10 MED ORDER — HYDROCORTISONE-ACETIC ACID 1-2 % OT SOLN: OTIC | 0 refills | Status: DC

## 2020-02-10 MED ORDER — PREDNISONE 20 MG PO TABS: ORAL_TABLET | ORAL | 0 refills | Status: DC

## 2020-02-10 NOTE — Telephone Encounter (Signed)
Patient called for her COVID-19 test result. There was no test pending. Nothing in chart. Patient states she is at her Dr office and they confirm they do not see that she had a test done. Patient states it was done yesterday. Patient was given number for [facility]. She will call them to see if they can find her test. If not patient was instructed to have her test repeated.

## 2020-02-10 NOTE — Telephone Encounter (Signed)
Error

## 2020-02-10 NOTE — ED Provider Notes (Signed)
[facility]    CSN: [id number] Arrival date & time: 02/10/20  1725      History   Chief Complaint Chief Complaint  Patient presents with  . Appointment  . Nasal Congestion  . Otalgia    left     HPI Kristin Gonzalez is a 34 y.o. female.   Patient complains of increased sinus congestion, and pain in her left ear for 4 to 5 days.  She is concerned that she may have a strep throat.  She also complains of recurring bilateral itching in her ear canals.  She feels well otherwise.  She has a PCR COVID test pending.  The history is provided by the patient.    Past Medical History:  Diagnosis Date  . Medical history non-contributory   . Postpartum care following cesarean delivery (8/28) 01/30/2013  . Postpartum care following cesarean delivery (9/23) 02/26/2015  . Postpartum care following cesarean delivery (9/23) 02/26/2015  . Vitamin D deficiency     Patient Active Problem List   Diagnosis Date Noted  . Hyperlipidemia 06/23/2019  . Tobacco use 09/19/2013    Past Surgical History:  Procedure Laterality Date  . CESAREAN SECTION N/A 01/30/2013   Procedure: CESAREAN SECTION;  Surgeon: Robley Fries, MD;  Location: [facility];  Service: Obstetrics;  Laterality: N/A;  . CESAREAN SECTION N/A 02/26/2015   Procedure: Repeat CESAREAN SECTION;  Surgeon: Maxie Better, MD;  Location: [facility];  Service: Obstetrics;  Laterality: N/A;  . CESAREAN SECTION N/A 10/24/2017   Procedure: Repeat CESAREAN SECTION;  Surgeon: Maxie Better, MD;  Location: [facility];  Service: Obstetrics;  Laterality: N/A;  EDD: 10/30/17  . IUD REMOVAL N/A 08/28/2019   Procedure: INTRAUTERINE DEVICE (IUD) REMOVAL;  Surgeon: Shea Evans, MD;  Location: [facility];  Service: Gynecology;  Laterality: N/A;  . LAPAROSCOPIC TUBAL LIGATION Bilateral 08/28/2019   Procedure: LAPAROSCOPIC TUBAL LIGATION BY SALPINGECTOMY;  Surgeon: Shea Evans, MD;  Location: [facility];   Service: Gynecology;  Laterality: Bilateral;  . WISDOM TOOTH EXTRACTION  2018    OB History    Gravida  5   Para  3   Term  3   Preterm      AB  2   Living  3     SAB      TAB  2   Ectopic      Multiple  0   Live Births  3            Home Medications    Prior to Admission medications   Medication Sig Start Date End Date Taking? Authorizing Provider  busPIRone (BUSPAR) 5 MG tablet Take 5 mg by mouth at bedtime. 02/03/20  Yes [provider]  carbamazepine (TEGRETOL) 200 MG tablet Take 1 tablet (200 mg total) by mouth daily. 12/26/19  Yes Sandford Craze, NP  loratadine (CLARITIN) 10 MG tablet Take 10 mg by mouth as needed for allergies.   Yes [provider]  Vitamin D, Cholecalciferol, 50 MCG (2000 UT) CAPS Take by mouth.   Yes [provider]  acetic acid-hydrocortisone (VOSOL-HC) OTIC solution Place 3 to 4 drops in affected ear for 3 to 4 days until itching stops 02/10/20   Lattie Haw, MD  cetirizine (ZYRTEC) 5 MG tablet Take 5 mg by mouth daily.    [provider]  predniSONE (DELTASONE) 20 MG tablet Take one tab by mouth twice daily for 4 days, then one daily for 3 days.  Take with food. 02/10/20   Lattie Haw, MD    Family History Family History  Problem Relation Age of Onset  . Hyperlipidemia Mother   . Hypertension Father   . Heart block Maternal Grandmother     Social History Social History   Tobacco Use  . Smoking status: Former Smoker    Packs/day: 0.50    Years: 10.00    Pack years: 5.00    Types: Cigarettes  . Smokeless tobacco: Never Used  . Tobacco comment: quit 2016  Vaping Use  . Vaping Use: Never used  Substance Use Topics  . Alcohol use: No  . Drug use: No     Allergies   Patient has no known allergies.   Review of Systems Review of Systems ? sore throat No cough No pleuritic pain No wheezing + nasal congestion + post-nasal drainage No sinus pain/pressure No itchy/red  eyes + left earache No hemoptysis No SOB No fever/chills No nausea No vomiting No abdominal pain No diarrhea No urinary symptoms No skin rash No fatigue No myalgias No headache    Physical Exam Triage Vital Signs ED Triage Vitals  Enc Vitals Group     BP 02/10/20 1836 116/74     Pulse Rate 02/10/20 1836 74     Resp 02/10/20 1836 15     Temp 02/10/20 1836 98.9 F (37.2 C)     Temp Source 02/10/20 1836 Oral     SpO2 02/10/20 1836 98 %     Weight --      Height --      Head Circumference --      Peak Flow --      Pain Score 02/10/20 1838 3     Pain Loc --      Pain Edu? --      Excl. in GC? --    No data found.  Updated Vital Signs BP 116/74 (BP Location: Right Arm)   Pulse 74   Temp 98.9 F (37.2 C) (Oral)   Resp 15   LMP 02/08/2020 (Exact Date)   SpO2 98%   Breastfeeding No   Visual Acuity Right Eye Distance:   Left Eye Distance:   Bilateral Distance:    Right Eye Near:   Left Eye Near:    Bilateral Near:     Physical Exam Nursing notes and Vital Signs reviewed. Appearance:  Patient appears stated age, and in no acute distress Eyes:  Pupils are equal, round, and reactive to light and accomodation.  Extraocular movement is intact.  Conjunctivae are not inflamed  Ears:  Canals normal.  Tympanic membranes normal. There is distinct tenderness over the left temporomandibular joint.  Palpation there recreates her pain.  Nose:  Mildly congested turbinates.  No sinus tenderness. Pharynx:  Normal Neck:  Supple. No adenopathy Lungs:  Clear to auscultation.  Breath sounds are equal.  Moving air well. Heart:  Regular rate and rhythm without murmurs, rubs, or gallops.  Abdomen:  Nontender without masses or hepatosplenomegaly.  Bowel sounds are present.  No CVA or flank tenderness.  Extremities:  No edema.  Skin:  No rash present.   UC Treatments / Results  Labs (all labs ordered are listed, but only abnormal results are displayed) Labs Reviewed  POCT RAPID  STREP A (OFFICE) negative  Tympanometry:  Right ear tympanogram positive peak pressure; Left ear tympanogram positive peak pressure  EKG   Radiology No results found.  Procedures Procedures (including critical care time)  Medications Ordered  in UC Medications - No data to display  Initial Impression / Assessment and Plan / UC Course  I have reviewed the triage vital signs and the nursing notes.  Pertinent labs & imaging results that were available during my care of the patient were reviewed by me and considered in my medical decision making (see chart for details).    No evidence otitis media or externa. Begin prednisone burst/taper. Rx for VoSol HC. Followup with ENT if not improved two weeks.   Final Clinical Impressions(s) / UC Diagnoses   Final diagnoses:  Eustachian tube dysfunction, bilateral  Arthralgia of left temporomandibular joint  Itching of ear     Discharge Instructions     1. Put ice on the left TMJ ? Put ice in a plastic bag. ? Place a towel between your skin and the bag. ? Leave the ice on for 20 minutes, 2-3 times a day. 2. Recommend dental evaluation.    ED Prescriptions    Medication Sig Dispense Auth. Provider   predniSONE (DELTASONE) 20 MG tablet Take one tab by mouth twice daily for 4 days, then one daily for 3 days. Take with food. 11 tablet Lattie Haw, MD   acetic acid-hydrocortisone (VOSOL-HC) OTIC solution Place 3 to 4 drops in affected ear for 3 to 4 days until itching stops 10 mL Lattie Haw, MD        Lattie Haw, MD 02/13/20 [phone]

## 2020-02-10 NOTE — ED Triage Notes (Signed)
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

## 2020-02-10 NOTE — Discharge Instructions (Addendum)
Put ice on the left TMJ Put ice in a plastic bag. Place a towel between your skin and the bag. Leave the ice on for 20 minutes, 2-3 times a day. Recommend dental evaluation.

## 2020-02-11 MED FILL — HYDROCORTISON-ACETIC ACID S: 1-2 | 14 days supply | Qty: 10 | Fill #0

## 2020-02-11 MED FILL — predniSONE 20 MG TABS: 20 | 7 days supply | Qty: 11 | Fill #0

## 2020-02-19 MED FILL — buPROPion HCL ER (XL) 150 M: 150 | 30 days supply | Qty: 30 | Fill #0

## 2020-02-19 MED FILL — busPIRone HCL 10 MG TABS: 10 | 30 days supply | Qty: 30 | Fill #0

## 2020-02-23 ENCOUNTER — Telehealth: Payer: No Typology Code available for payment source | Admitting: Family

## 2020-02-23 ENCOUNTER — Encounter: Payer: Self-pay | Admitting: Family

## 2020-02-23 DIAGNOSIS — J069 Acute upper respiratory infection, unspecified: Secondary | ICD-10-CM

## 2020-02-23 MED ORDER — BENZONATATE 100 MG PO CAPS: 100.0000 mg | ORAL_CAPSULE | Freq: Three times a day (TID) | ORAL | 0 refills | Status: DC | PRN

## 2020-02-23 MED ORDER — AMOXICILLIN 500 MG PO CAPS: 500.0000 mg | ORAL_CAPSULE | Freq: Two times a day (BID) | ORAL | 0 refills | Status: DC

## 2020-02-23 MED ORDER — FLUTICASONE PROPIONATE 50 MCG/ACT NA SUSP: 2.0000 | Freq: Every day | NASAL | 6 refills | Status: AC

## 2020-02-23 MED FILL — BENZONATATE 100 MG CAPS: 100 | 7 days supply | Qty: 20 | Fill #0

## 2020-02-23 MED FILL — AMOXICILLIN 500 MG CAPSULE: 500 | 10 days supply | Qty: 20 | Fill #0

## 2020-02-23 NOTE — Addendum Note (Signed)
Addended by: Jannifer Rodney A on: 02/23/2020 03:39 PM   Modules accepted: Orders

## 2020-02-23 NOTE — Progress Notes (Signed)

## 2020-02-29 ENCOUNTER — Telehealth: Payer: No Typology Code available for payment source | Admitting: Orthopedic Surgery

## 2020-02-29 DIAGNOSIS — B3731 Acute candidiasis of vulva and vagina: Secondary | ICD-10-CM

## 2020-02-29 DIAGNOSIS — B373 Candidiasis of vulva and vagina: Secondary | ICD-10-CM

## 2020-02-29 MED ORDER — FLUCONAZOLE 150 MG PO TABS: 150.0000 mg | ORAL_TABLET | Freq: Once | ORAL | 0 refills | Status: AC

## 2020-02-29 MED ORDER — FLUCONAZOLE 150 MG PO TABS: 150.0000 mg | ORAL_TABLET | Freq: Once | ORAL | 0 refills | Status: DC

## 2020-02-29 NOTE — Addendum Note (Signed)
Addended by: Charma Igo on: 02/29/2020 09:28 AM   Modules accepted: Orders

## 2020-02-29 NOTE — Progress Notes (Signed)
We are sorry that you are not feeling well. Here is how we plan to help! Based on what you shared with me it looks like you: May have a yeast vaginosis  Vaginosis is an inflammation of the vagina that can result in discharge, itching and pain. The cause is usually a change in the normal balance of vaginal bacteria or an infection. Vaginosis can also result from reduced estrogen levels after menopause.  The most common causes of vaginosis are:   Bacterial vaginosis which results from an overgrowth of one on several organisms that are normally present in your vagina.   Yeast infections which are caused by a naturally occurring fungus called candida.   Vaginal atrophy (atrophic vaginosis) which results from the thinning of the vagina from reduced estrogen levels after menopause.   Trichomoniasis which is caused by a parasite and is commonly transmitted by sexual intercourse.  Factors that increase your risk of developing vaginosis include: . Medications, such as antibiotics and steroids . Uncontrolled diabetes . Use of hygiene products such as bubble bath, vaginal spray or vaginal deodorant . Douching . Wearing damp or tight-fitting clothing . Using an intrauterine device (IUD) for birth control . Hormonal changes, such as those associated with pregnancy, birth control pills or menopause . Sexual activity . Having a sexually transmitted infection  Your treatment plan is A single Diflucan (fluconazole) 150mg tablet once.  I have electronically sent this prescription into the pharmacy that you have chosen.  Be sure to take all of the medication as directed. Stop taking any medication if you develop a rash, tongue swelling or shortness of breath. Mothers who are breast feeding should consider pumping and discarding their breast milk while on these antibiotics. However, there is no consensus that infant exposure at these doses would be harmful.  Remember that medication creams can weaken latex  condoms. .   HOME CARE:  Good hygiene may prevent some types of vaginosis from recurring and may relieve some symptoms:  . Avoid baths, hot tubs and whirlpool spas. Rinse soap from your outer genital area after a shower, and dry the area well to prevent irritation. Don't use scented or harsh soaps, such as those with deodorant or antibacterial action. . Avoid irritants. These include scented tampons and pads. . Wipe from front to back after using the toilet. Doing so avoids spreading fecal bacteria to your vagina.  Other things that may help prevent vaginosis include:  . Don't douche. Your vagina doesn't require cleansing other than normal bathing. Repetitive douching disrupts the normal organisms that reside in the vagina and can actually increase your risk of vaginal infection. Douching won't clear up a vaginal infection. . Use a latex condom. Both female and female latex condoms may help you avoid infections spread by sexual contact. . Wear cotton underwear. Also wear pantyhose with a cotton crotch. If you feel comfortable without it, skip wearing underwear to bed. Yeast thrives in moist environments Your symptoms should improve in the next day or two.  GET HELP RIGHT AWAY IF:  . You have pain in your lower abdomen ( pelvic area or over your ovaries) . You develop nausea or vomiting . You develop a fever . Your discharge changes or worsens . You have persistent pain with intercourse . You develop shortness of breath, a rapid pulse, or you faint.  These symptoms could be signs of problems or infections that need to be evaluated by a medical provider now.  MAKE SURE YOU      Understand these instructions.  Will watch your condition.  Will get help right away if you are not doing well or get worse.  Your e-visit answers were reviewed by a board certified advanced clinical practitioner to complete your personal care plan. Depending upon the condition, your plan could have included  both over the counter or prescription medications. Please review your pharmacy choice to make sure that you have choses a pharmacy that is open for you to pick up any needed prescription, Your safety is important to us. If you have drug allergies check your prescription carefully.   You can use MyChart to ask questions about today's visit, request a non-urgent call back, or ask for a work or school excuse for 24 hours related to this e-Visit. If it has been greater than 24 hours you will need to follow up with your provider, or enter a new e-Visit to address those concerns. You will get a MyChart message within the next two days asking about your experience. I hope that your e-visit has been valuable and will speed your recovery.  Greater than 5 minutes, yet less than 10 minutes of time have been spent researching, coordinating and implementing care for this patient today.   

## 2020-03-01 MED FILL — FLUCONAZOLE 150 MG TABS: 150 | 1 days supply | Qty: 1 | Fill #0

## 2020-03-03 ENCOUNTER — Other Ambulatory Visit (HOSPITAL_BASED_OUTPATIENT_CLINIC_OR_DEPARTMENT_OTHER): Payer: Self-pay | Admitting: Psychiatry

## 2020-03-03 MED FILL — carBAMazepine 200 MG TABS: 200 | 30 days supply | Qty: 60 | Fill #0

## 2020-03-18 MED FILL — buPROPion HCL ER (XL) 150 M: 150 | 30 days supply | Qty: 30 | Fill #0

## 2020-04-05 ENCOUNTER — Other Ambulatory Visit (HOSPITAL_BASED_OUTPATIENT_CLINIC_OR_DEPARTMENT_OTHER): Payer: Self-pay | Admitting: Psychiatry

## 2020-04-05 MED FILL — carBAMazepine 200 MG TABS: 200 | 90 days supply | Qty: 180 | Fill #0

## 2020-04-19 MED FILL — buPROPion HCL ER (XL) 150 M: 150 | 90 days supply | Qty: 90 | Fill #0

## 2021-03-09 DIAGNOSIS — H608X3 Other otitis externa, bilateral: Secondary | ICD-10-CM | POA: Insufficient documentation

## 2022-04-06 DIAGNOSIS — E559 Vitamin D deficiency, unspecified: Secondary | ICD-10-CM | POA: Insufficient documentation

## 2022-04-06 DIAGNOSIS — M5412 Radiculopathy, cervical region: Secondary | ICD-10-CM | POA: Insufficient documentation

## 2022-06-08 LAB — HM PAP SMEAR

## 2022-06-08 LAB — RESULTS CONSOLE HPV: CHL HPV: NEGATIVE

## 2022-07-06 DIAGNOSIS — N939 Abnormal uterine and vaginal bleeding, unspecified: Secondary | ICD-10-CM | POA: Insufficient documentation

## 2023-01-29 DIAGNOSIS — F320 Major depressive disorder, single episode, mild: Secondary | ICD-10-CM | POA: Diagnosis not present

## 2023-01-29 DIAGNOSIS — F900 Attention-deficit hyperactivity disorder, predominantly inattentive type: Secondary | ICD-10-CM | POA: Diagnosis not present

## 2023-01-29 DIAGNOSIS — F411 Generalized anxiety disorder: Secondary | ICD-10-CM | POA: Diagnosis not present

## 2023-03-27 DIAGNOSIS — F411 Generalized anxiety disorder: Secondary | ICD-10-CM | POA: Diagnosis not present

## 2023-03-27 DIAGNOSIS — F900 Attention-deficit hyperactivity disorder, predominantly inattentive type: Secondary | ICD-10-CM | POA: Diagnosis not present

## 2023-03-27 DIAGNOSIS — F320 Major depressive disorder, single episode, mild: Secondary | ICD-10-CM | POA: Diagnosis not present

## 2023-04-11 ENCOUNTER — Other Ambulatory Visit (HOSPITAL_BASED_OUTPATIENT_CLINIC_OR_DEPARTMENT_OTHER): Payer: Self-pay

## 2023-04-11 ENCOUNTER — Other Ambulatory Visit: Payer: Self-pay

## 2023-04-11 DIAGNOSIS — F900 Attention-deficit hyperactivity disorder, predominantly inattentive type: Secondary | ICD-10-CM | POA: Diagnosis not present

## 2023-04-11 DIAGNOSIS — F411 Generalized anxiety disorder: Secondary | ICD-10-CM | POA: Diagnosis not present

## 2023-04-11 DIAGNOSIS — F320 Major depressive disorder, single episode, mild: Secondary | ICD-10-CM | POA: Diagnosis not present

## 2023-04-11 MED ORDER — LISDEXAMFETAMINE DIMESYLATE 20 MG PO CHEW
20.0000 mg | CHEWABLE_TABLET | Freq: Every morning | ORAL | 0 refills | Status: DC
  Filled 2023-04-11: qty 30, 30d supply, fill #0

## 2023-04-11 MED ORDER — CLONIDINE HCL 0.1 MG PO TABS
0.1000 mg | ORAL_TABLET | Freq: Every day | ORAL | 0 refills | Status: DC | PRN
  Filled 2023-04-11: qty 30, 30d supply, fill #0

## 2023-04-11 MED ORDER — FLUOXETINE HCL 10 MG PO TABS
10.0000 mg | ORAL_TABLET | Freq: Every day | ORAL | 0 refills | Status: DC
  Filled 2023-04-11: qty 30, 30d supply, fill #0

## 2023-04-12 ENCOUNTER — Other Ambulatory Visit (HOSPITAL_BASED_OUTPATIENT_CLINIC_OR_DEPARTMENT_OTHER): Payer: Self-pay

## 2023-04-12 ENCOUNTER — Other Ambulatory Visit: Payer: Self-pay

## 2023-04-26 DIAGNOSIS — F4322 Adjustment disorder with anxiety: Secondary | ICD-10-CM | POA: Diagnosis not present

## 2023-05-16 ENCOUNTER — Other Ambulatory Visit (HOSPITAL_BASED_OUTPATIENT_CLINIC_OR_DEPARTMENT_OTHER): Payer: Self-pay

## 2023-05-16 DIAGNOSIS — F900 Attention-deficit hyperactivity disorder, predominantly inattentive type: Secondary | ICD-10-CM | POA: Diagnosis not present

## 2023-05-16 DIAGNOSIS — F411 Generalized anxiety disorder: Secondary | ICD-10-CM | POA: Diagnosis not present

## 2023-05-16 DIAGNOSIS — F320 Major depressive disorder, single episode, mild: Secondary | ICD-10-CM | POA: Diagnosis not present

## 2023-05-16 MED ORDER — LISDEXAMFETAMINE DIMESYLATE 20 MG PO CHEW
20.0000 mg | CHEWABLE_TABLET | Freq: Every morning | ORAL | 0 refills | Status: DC
  Filled 2023-05-16: qty 30, 30d supply, fill #0

## 2023-05-16 MED ORDER — FLUOXETINE HCL 20 MG PO CAPS
20.0000 mg | ORAL_CAPSULE | Freq: Every day | ORAL | 0 refills | Status: DC
  Filled 2023-05-16: qty 30, 30d supply, fill #0

## 2023-05-17 ENCOUNTER — Other Ambulatory Visit (HOSPITAL_BASED_OUTPATIENT_CLINIC_OR_DEPARTMENT_OTHER): Payer: Self-pay

## 2023-05-17 MED ORDER — NICOTINE 7 MG/24HR TD PT24
7.0000 mg | MEDICATED_PATCH | Freq: Every day | TRANSDERMAL | 12 refills | Status: DC
  Filled 2023-05-17: qty 28, 28d supply, fill #0

## 2023-05-17 MED ORDER — NICOTINE POLACRILEX 2 MG MT GUM: 2.0000 mg | CHEWING_GUM | OROMUCOSAL | 30 refills | Status: DC | PRN

## 2023-05-18 ENCOUNTER — Other Ambulatory Visit (HOSPITAL_BASED_OUTPATIENT_CLINIC_OR_DEPARTMENT_OTHER): Payer: Self-pay

## 2023-05-21 ENCOUNTER — Other Ambulatory Visit (HOSPITAL_BASED_OUTPATIENT_CLINIC_OR_DEPARTMENT_OTHER): Payer: Self-pay

## 2023-05-21 MED ORDER — FLUOXETINE HCL 20 MG PO TABS
20.0000 mg | ORAL_TABLET | Freq: Every day | ORAL | 0 refills | Status: DC
  Filled 2023-05-21: qty 30, 30d supply, fill #0

## 2023-06-08 ENCOUNTER — Other Ambulatory Visit (HOSPITAL_BASED_OUTPATIENT_CLINIC_OR_DEPARTMENT_OTHER): Payer: Self-pay

## 2023-06-08 DIAGNOSIS — F900 Attention-deficit hyperactivity disorder, predominantly inattentive type: Secondary | ICD-10-CM | POA: Diagnosis not present

## 2023-06-08 DIAGNOSIS — F411 Generalized anxiety disorder: Secondary | ICD-10-CM | POA: Diagnosis not present

## 2023-06-08 DIAGNOSIS — F320 Major depressive disorder, single episode, mild: Secondary | ICD-10-CM | POA: Diagnosis not present

## 2023-06-08 MED ORDER — LISDEXAMFETAMINE DIMESYLATE 20 MG PO CHEW
20.0000 mg | CHEWABLE_TABLET | Freq: Every morning | ORAL | 0 refills | Status: DC
  Filled 2023-06-08 – 2023-08-10 (×3): qty 30, 30d supply, fill #0

## 2023-06-08 MED ORDER — LAMOTRIGINE 25 MG PO TABS
25.0000 mg | ORAL_TABLET | Freq: Every day | ORAL | 0 refills | Status: DC
  Filled 2023-06-08: qty 30, 30d supply, fill #0

## 2023-06-19 DIAGNOSIS — F4322 Adjustment disorder with anxiety: Secondary | ICD-10-CM | POA: Diagnosis not present

## 2023-06-26 DIAGNOSIS — F320 Major depressive disorder, single episode, mild: Secondary | ICD-10-CM | POA: Diagnosis not present

## 2023-06-26 DIAGNOSIS — F411 Generalized anxiety disorder: Secondary | ICD-10-CM | POA: Diagnosis not present

## 2023-06-26 DIAGNOSIS — F900 Attention-deficit hyperactivity disorder, predominantly inattentive type: Secondary | ICD-10-CM | POA: Diagnosis not present

## 2023-06-27 ENCOUNTER — Other Ambulatory Visit (HOSPITAL_BASED_OUTPATIENT_CLINIC_OR_DEPARTMENT_OTHER): Payer: Self-pay

## 2023-06-27 MED ORDER — LISDEXAMFETAMINE DIMESYLATE 20 MG PO CHEW
20.0000 mg | CHEWABLE_TABLET | Freq: Every morning | ORAL | 0 refills | Status: DC
  Filled 2023-06-27 – 2023-07-13 (×2): qty 30, 30d supply, fill #0

## 2023-06-27 MED ORDER — LISDEXAMFETAMINE DIMESYLATE 20 MG PO CHEW
20.0000 mg | CHEWABLE_TABLET | Freq: Every morning | ORAL | 0 refills | Status: DC
  Filled 2023-09-07: qty 30, 30d supply, fill #0

## 2023-07-04 DIAGNOSIS — F4322 Adjustment disorder with anxiety: Secondary | ICD-10-CM | POA: Diagnosis not present

## 2023-07-11 ENCOUNTER — Other Ambulatory Visit (HOSPITAL_BASED_OUTPATIENT_CLINIC_OR_DEPARTMENT_OTHER): Payer: Self-pay

## 2023-07-13 ENCOUNTER — Other Ambulatory Visit (HOSPITAL_BASED_OUTPATIENT_CLINIC_OR_DEPARTMENT_OTHER): Payer: Self-pay

## 2023-08-10 ENCOUNTER — Other Ambulatory Visit (HOSPITAL_BASED_OUTPATIENT_CLINIC_OR_DEPARTMENT_OTHER): Payer: Self-pay

## 2023-08-10 ENCOUNTER — Other Ambulatory Visit (HOSPITAL_COMMUNITY): Payer: Self-pay

## 2023-08-17 DIAGNOSIS — F900 Attention-deficit hyperactivity disorder, predominantly inattentive type: Secondary | ICD-10-CM | POA: Diagnosis not present

## 2023-08-17 DIAGNOSIS — F320 Major depressive disorder, single episode, mild: Secondary | ICD-10-CM | POA: Diagnosis not present

## 2023-08-17 DIAGNOSIS — F411 Generalized anxiety disorder: Secondary | ICD-10-CM | POA: Diagnosis not present

## 2023-09-03 ENCOUNTER — Other Ambulatory Visit (HOSPITAL_BASED_OUTPATIENT_CLINIC_OR_DEPARTMENT_OTHER): Payer: Self-pay

## 2023-09-03 DIAGNOSIS — F3281 Premenstrual dysphoric disorder: Secondary | ICD-10-CM | POA: Diagnosis not present

## 2023-09-03 DIAGNOSIS — Z1331 Encounter for screening for depression: Secondary | ICD-10-CM | POA: Diagnosis not present

## 2023-09-03 DIAGNOSIS — Z124 Encounter for screening for malignant neoplasm of cervix: Secondary | ICD-10-CM | POA: Diagnosis not present

## 2023-09-03 DIAGNOSIS — Z01411 Encounter for gynecological examination (general) (routine) with abnormal findings: Secondary | ICD-10-CM | POA: Diagnosis not present

## 2023-09-03 DIAGNOSIS — Z113 Encounter for screening for infections with a predominantly sexual mode of transmission: Secondary | ICD-10-CM | POA: Diagnosis not present

## 2023-09-03 DIAGNOSIS — Z01419 Encounter for gynecological examination (general) (routine) without abnormal findings: Secondary | ICD-10-CM | POA: Diagnosis not present

## 2023-09-03 DIAGNOSIS — R87810 Cervical high risk human papillomavirus (HPV) DNA test positive: Secondary | ICD-10-CM | POA: Diagnosis not present

## 2023-09-03 MED ORDER — LO LOESTRIN FE 1 MG-10 MCG / 10 MCG PO TABS
1.0000 | ORAL_TABLET | Freq: Every day | ORAL | 3 refills | Status: DC
  Filled 2023-09-03: qty 84, 84d supply, fill #0

## 2023-09-07 ENCOUNTER — Other Ambulatory Visit (HOSPITAL_BASED_OUTPATIENT_CLINIC_OR_DEPARTMENT_OTHER): Payer: Self-pay

## 2023-09-07 ENCOUNTER — Other Ambulatory Visit (HOSPITAL_COMMUNITY): Payer: Self-pay

## 2023-09-11 ENCOUNTER — Other Ambulatory Visit (HOSPITAL_BASED_OUTPATIENT_CLINIC_OR_DEPARTMENT_OTHER): Payer: Self-pay

## 2023-09-11 MED ORDER — FOLIC ACID 1 MG PO TABS
1.0000 mg | ORAL_TABLET | Freq: Every day | ORAL | 3 refills | Status: AC
  Filled 2023-09-11: qty 90, 90d supply, fill #0

## 2023-09-12 ENCOUNTER — Other Ambulatory Visit (HOSPITAL_BASED_OUTPATIENT_CLINIC_OR_DEPARTMENT_OTHER): Payer: Self-pay

## 2023-09-12 DIAGNOSIS — F320 Major depressive disorder, single episode, mild: Secondary | ICD-10-CM | POA: Diagnosis not present

## 2023-09-12 DIAGNOSIS — F411 Generalized anxiety disorder: Secondary | ICD-10-CM | POA: Diagnosis not present

## 2023-09-12 DIAGNOSIS — F900 Attention-deficit hyperactivity disorder, predominantly inattentive type: Secondary | ICD-10-CM | POA: Diagnosis not present

## 2023-09-12 MED ORDER — AMPHETAMINE-DEXTROAMPHET ER 10 MG PO CP24
10.0000 mg | ORAL_CAPSULE | Freq: Every morning | ORAL | 0 refills | Status: DC
Start: 2023-09-12 — End: 2023-10-03
  Filled 2023-09-12: qty 30, 30d supply, fill #0

## 2023-10-03 ENCOUNTER — Other Ambulatory Visit (HOSPITAL_BASED_OUTPATIENT_CLINIC_OR_DEPARTMENT_OTHER): Payer: Self-pay

## 2023-10-03 DIAGNOSIS — F320 Major depressive disorder, single episode, mild: Secondary | ICD-10-CM | POA: Diagnosis not present

## 2023-10-03 DIAGNOSIS — F411 Generalized anxiety disorder: Secondary | ICD-10-CM | POA: Diagnosis not present

## 2023-10-03 DIAGNOSIS — F900 Attention-deficit hyperactivity disorder, predominantly inattentive type: Secondary | ICD-10-CM | POA: Diagnosis not present

## 2023-10-03 MED ORDER — AMPHETAMINE-DEXTROAMPHET ER 10 MG PO CP24
10.0000 mg | ORAL_CAPSULE | Freq: Every morning | ORAL | 0 refills | Status: DC
  Filled 2023-10-18: qty 30, 30d supply, fill #0

## 2023-10-03 MED ORDER — VILAZODONE HCL 10 MG PO TABS
10.0000 mg | ORAL_TABLET | Freq: Every day | ORAL | 0 refills | Status: DC
Start: 2023-10-03 — End: 2023-10-17
  Filled 2023-10-03: qty 30, 30d supply, fill #0

## 2023-10-03 MED ORDER — AMPHETAMINE-DEXTROAMPHETAMINE 5 MG PO TABS
5.0000 mg | ORAL_TABLET | Freq: Every day | ORAL | 0 refills | Status: DC
  Filled 2023-10-03: qty 30, 30d supply, fill #0

## 2023-10-17 ENCOUNTER — Other Ambulatory Visit (HOSPITAL_BASED_OUTPATIENT_CLINIC_OR_DEPARTMENT_OTHER): Payer: Self-pay

## 2023-10-17 DIAGNOSIS — F900 Attention-deficit hyperactivity disorder, predominantly inattentive type: Secondary | ICD-10-CM | POA: Diagnosis not present

## 2023-10-17 DIAGNOSIS — F320 Major depressive disorder, single episode, mild: Secondary | ICD-10-CM | POA: Diagnosis not present

## 2023-10-17 DIAGNOSIS — F411 Generalized anxiety disorder: Secondary | ICD-10-CM | POA: Diagnosis not present

## 2023-10-17 MED ORDER — VILAZODONE HCL 10 MG PO TABS
10.0000 mg | ORAL_TABLET | Freq: Every day | ORAL | 0 refills | Status: DC
Start: 2023-10-17 — End: 2024-03-19
  Filled 2023-10-17 – 2023-11-06 (×2): qty 30, 30d supply, fill #0

## 2023-10-18 ENCOUNTER — Other Ambulatory Visit (HOSPITAL_BASED_OUTPATIENT_CLINIC_OR_DEPARTMENT_OTHER): Payer: Self-pay

## 2023-10-31 ENCOUNTER — Ambulatory Visit: Payer: 59 | Admitting: Family Medicine

## 2023-10-31 ENCOUNTER — Encounter: Payer: Self-pay | Admitting: Family Medicine

## 2023-10-31 VITALS — BP 124/70 | HR 117 | Ht 60.0 in | Wt 123.8 lb

## 2023-10-31 DIAGNOSIS — Z8249 Family history of ischemic heart disease and other diseases of the circulatory system: Secondary | ICD-10-CM | POA: Diagnosis not present

## 2023-10-31 DIAGNOSIS — Z0000 Encounter for general adult medical examination without abnormal findings: Secondary | ICD-10-CM | POA: Diagnosis not present

## 2023-10-31 DIAGNOSIS — Z1159 Encounter for screening for other viral diseases: Secondary | ICD-10-CM | POA: Diagnosis not present

## 2023-10-31 DIAGNOSIS — F4322 Adjustment disorder with anxiety: Secondary | ICD-10-CM | POA: Insufficient documentation

## 2023-10-31 DIAGNOSIS — N9489 Other specified conditions associated with female genital organs and menstrual cycle: Secondary | ICD-10-CM | POA: Insufficient documentation

## 2023-10-31 DIAGNOSIS — Z111 Encounter for screening for respiratory tuberculosis: Secondary | ICD-10-CM | POA: Diagnosis not present

## 2023-10-31 DIAGNOSIS — F339 Major depressive disorder, recurrent, unspecified: Secondary | ICD-10-CM | POA: Diagnosis not present

## 2023-10-31 DIAGNOSIS — Z716 Tobacco abuse counseling: Secondary | ICD-10-CM

## 2023-10-31 DIAGNOSIS — F902 Attention-deficit hyperactivity disorder, combined type: Secondary | ICD-10-CM | POA: Diagnosis not present

## 2023-10-31 LAB — COMPREHENSIVE METABOLIC PANEL WITH GFR
ALT: 37 U/L — ABNORMAL HIGH (ref 0–35)
AST: 27 U/L (ref 0–37)
Albumin: 4.5 g/dL (ref 3.5–5.2)
Alkaline Phosphatase: 52 U/L (ref 39–117)
BUN: 9 mg/dL (ref 6–23)
CO2: 25 meq/L (ref 19–32)
Calcium: 9.1 mg/dL (ref 8.4–10.5)
Chloride: 103 meq/L (ref 96–112)
Creatinine, Ser: 0.57 mg/dL (ref 0.40–1.20)
GFR: 116.97 mL/min (ref >60.00)
Glucose, Bld: 92 mg/dL (ref 70–99)
Potassium: 3.9 meq/L (ref 3.5–5.1)
Sodium: 135 meq/L (ref 135–145)
Total Bilirubin: 0.3 mg/dL (ref 0.2–1.2)
Total Protein: 7.4 g/dL (ref 6.0–8.3)

## 2023-10-31 LAB — CBC WITH DIFFERENTIAL/PLATELET
Basophils Absolute: 0.1 10*3/uL (ref 0.0–0.1)
Basophils Relative: 0.6 % (ref 0.0–3.0)
Eosinophils Absolute: 0.6 10*3/uL (ref 0.0–0.7)
Eosinophils Relative: 6.7 % — ABNORMAL HIGH (ref 0.0–5.0)
HCT: 35.3 % — ABNORMAL LOW (ref 36.0–46.0)
Hemoglobin: 11.5 g/dL — ABNORMAL LOW (ref 12.0–15.0)
Lymphocytes Relative: 43 % (ref 12.0–46.0)
Lymphs Abs: 3.7 10*3/uL (ref 0.7–4.0)
MCHC: 32.6 g/dL (ref 30.0–36.0)
MCV: 79.5 fl (ref 78.0–100.0)
Monocytes Absolute: 0.5 10*3/uL (ref 0.1–1.0)
Monocytes Relative: 5.4 % (ref 3.0–12.0)
Neutro Abs: 3.8 10*3/uL (ref 1.4–7.7)
Neutrophils Relative %: 44.3 % (ref 43.0–77.0)
Platelets: 254 10*3/uL (ref 150.0–400.0)
RBC: 4.44 Mil/uL (ref 3.87–5.11)
RDW: 14.7 % (ref 11.5–15.5)
WBC: 8.6 10*3/uL (ref 4.0–10.5)

## 2023-10-31 LAB — LIPID PANEL
Cholesterol: 226 mg/dL — ABNORMAL HIGH (ref 0–200)
HDL: 70.2 mg/dL (ref >39.00)
LDL Cholesterol: 126 mg/dL — ABNORMAL HIGH (ref 0–99)
NonHDL: 155.67
Total CHOL/HDL Ratio: 3
Triglycerides: 147 mg/dL (ref 0.0–149.0)
VLDL: 29.4 mg/dL (ref 0.0–40.0)

## 2023-10-31 LAB — TSH: TSH: 1.09 u[IU]/mL (ref 0.35–5.50)

## 2023-10-31 LAB — VITAMIN D 25 HYDROXY (VIT D DEFICIENCY, FRACTURES): VITD: 19.7 ng/mL — ABNORMAL LOW (ref 30.00–100.00)

## 2023-10-31 NOTE — Progress Notes (Signed)
 Subjective  Chief Complaint  Patient presents with   Establish Care    Pt here with no particular concerns just need to a PCP    HPI: Kristin Gonzalez is a 38 y.o. female who presents to [facility] [place] today for a Female Wellness Visit.  She also has the concerns and/or needs as listed above in the chief complaint. These will be addressed in addition to the Health Maintenance Visit.  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  I saw her years ago.  Sees GYN and psychiatry.  I reviewed multiple records. Wellness Visit: annual visit with health maintenance review and exam Health maintenance: Female wellness up-to-date.  Recent Pap smear okay.  Smoker precontemplative to quitting.  Eligible for Prevnar but declines today.  Healthy lifestyle otherwise.  Very busy, works full-time, going to school and mother of 3.  Married, husband is mostly side home dad right now. Chronic disease management visit and/or acute problem visit: Chronic depression, anxiety and ADHD managed by psychiatry.  Has been on multiple mood medications and currently doing well. Needs TB screening for school.  No exposures History of pelvic pain and pelvic congestion syndrome, urogynecology PT, now resolved. Smoker: Half pack per day at this time.  Started 18 years ago.  Has been on and off smoker has tried vapes.  Would like to quit but due to high stress and busy lifestyle does not feel that she would be successful at this time.  Assessment  1. Annual physical exam   2. Major depression, recurrent, chronic (HCC)   3. Adjustment disorder with anxiety   4. Need for hepatitis C screening test   5. Screening for tuberculosis   6. Attention deficit hyperactivity disorder (ADHD), combined type   7. Encounter for tobacco use cessation counseling      Plan  Female Wellness Visit: Age appropriate Health Maintenance and Prevention measures were  discussed with patient. Included topics are cancer screening recommendations, ways to keep healthy (see AVS) including dietary and exercise recommendations, regular eye and dental care, use of seat belts, and avoidance of moderate alcohol  use and tobacco use.  Tobacco cessation counseling done.  Screens current BMI: discussed patient's BMI and encouraged positive lifestyle modifications to help get to or maintain a target BMI. HM needs and immunizations were addressed and ordered. See below for orders. See HM and immunization section for updates.  Defer Prevnar but will get in the future Routine labs and screening tests ordered including cmp, cbc and lipids where appropriate. Discussed recommendations regarding Vit D and calcium supplementation (see AVS)  Chronic disease f/u and/or acute problem visit: (deemed necessary to be done in addition to the wellness visit): ADHD and mood problems well-controlled by psychiatry: Continue current medications   Follow up: 12 months for complete physical, sooner for smoking cessation when ready  Orders Placed This Encounter  Procedures   HM PAP SMEAR   VITAMIN D  25 Hydroxy (Vit-D Deficiency, Fractures)   CBC with Differential/Platelet   Comprehensive metabolic panel with GFR   Lipid panel   TSH   QuantiFERON-TB Gold Plus   Hepatitis C antibody   No orders of the defined types were placed in this encounter.      Body mass index is 24.18 kg/m. Wt Readings from Last 3 Encounters:  10/31/23 123 lb 12.8 oz (56.2 kg)  08/28/19 120 lb 6.4 oz (54.6 kg)  06/20/19 121 lb (54.9  kg)   Need for contraception: No, tubal ligation  Patient Active Problem List   Diagnosis Date Noted Date Diagnosed   Major depression, recurrent, chronic (HCC) 10/31/2023     Priority: High   Adjustment disorder with anxiety 10/31/2023     Priority: High   Cervical radiculopathy, chronic 04/06/2022     Priority: High    Pt may take muscle relaxer (previously given by  urgent care)PRN.  Encouraged stretches, massage, heat,and NSAIDS prn. No redflag symptoms    Mixed hyperlipidemia 06/23/2019     Priority: High   Current every day smoker 09/19/2013     Priority: High   Abnormal uterine bleeding (AUB) 07/06/2022     Priority: Low    - TVUS neg today - TSH WNL 04/2022 - UPT neg 05/31/22 - Suspect anovulatory cycles or ovulating intermittently - As pt vapes and is 38 yo, would avoid estrogen-containing  - Trial of norethindrone    Vitamin D  deficiency 04/06/2022     Priority: Low    Labs ordered.    Chronic eczematous otitis externa of both ears 03/09/2021     Priority: Low   Female pelvic congestion syndrome 10/31/2023     CT abd/pelvis 2024 for pelvic pain    Attention deficit hyperactivity disorder (ADHD), combined type 10/31/2023    Health Maintenance  Topic Date Due   Hepatitis C Screening  Never done   COVID-19 Vaccine (3 - 2024-25 season) 11/16/2023 (Originally 02/04/2023)   Pneumococcal Vaccine 62-71 Years old (1 of 2 - PCV) 10/30/2024 (Originally 06/25/2006)   INFLUENZA VACCINE  01/04/2024   Cervical Cancer Screening (HPV/Pap Cotest)  06/09/2027   DTaP/Tdap/Td (2 - Td or Tdap) 06/20/2027   HIV Screening  Completed   HPV VACCINES  Aged Out   Meningococcal B Vaccine  Aged Out   Immunization History  Administered Date(s) Administered   Influenza,inj,Quad PF,6+ Mos 02/17/2019   PFIZER(Purple Top)SARS-COV-2 Vaccination 05/26/2019, 06/16/2019   Tdap 06/19/2017   We updated and reviewed the patient's past history in detail and it is documented below. Allergies: Patient  reports no history of alcohol  use. Past Medical History Patient  has a past medical history of Postpartum care following cesarean delivery (8/28) (01/30/2013), Postpartum care following cesarean delivery (9/23) (02/26/2015), and Vitamin D  deficiency. Past Surgical History Patient  has a past surgical history that includes Cesarean section (N/A, 01/30/2013); Cesarean  section (N/A, 02/26/2015); Cesarean section (N/A, 10/24/2017); Wisdom tooth extraction (2018); Laparoscopic tubal ligation (Bilateral, 08/28/2019); and IUD removal (N/A, 08/28/2019). Social History   Socioeconomic History   Marital status: Married    Spouse name: Not on file   Number of children: 3   Years of education: Not on file   Highest education level: Not on file  Occupational History   Not on file  Tobacco Use   Smoking status: Former    Current packs/day: 0.50    Average packs/day: 0.5 packs/day for 10.0 years (5.0 ttl pk-yrs)    Types: Cigarettes   Smokeless tobacco: Never   Tobacco comments:    quit 2016  Vaping Use   Vaping status: Every Day  Substance and Sexual Activity   Alcohol  use: No   Drug use: No   Sexual activity: Yes    Birth control/protection: Pill  Other Topics Concern   Not on file  Social History Narrative   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   Came to [place]  age 38, born in [place]   Married   2 daughters, 1 son  2 cats   Social Drivers of Corporate investment banker Strain: Not on file  Food Insecurity: Not on file  Transportation Needs: Not on file  Physical Activity: Inactive (06/20/2019)   Exercise Vital Sign    Days of Exercise per Week: 0 days    Minutes of Exercise per Session: 0 min  Stress: Not on file  Social Connections: Not on file   Family History  Problem Relation Age of Onset   Hyperlipidemia Mother    Hypertension Father    Heart block Maternal Grandmother     Review of Systems: Constitutional: negative for fever or malaise Ophthalmic: negative for photophobia, double vision or loss of vision Cardiovascular: negative for chest pain, dyspnea on exertion, or new LE swelling Respiratory: negative for SOB or persistent cough Gastrointestinal: negative for abdominal pain, change in bowel habits or melena Genitourinary: negative for dysuria or gross hematuria, no abnormal uterine bleeding or disharge Musculoskeletal: negative for  new gait disturbance or muscular weakness Integumentary: negative for new or persistent rashes, no breast lumps Neurological: negative for TIA or stroke symptoms Psychiatric: negative for SI or delusions Allergic/Immunologic: negative for hives  Patient Care Team    Relationship Specialty Notifications Start End  Luevenia Saha, MD PCP - General Family Medicine  10/31/23   Terri Fester, MD Consulting Physician Obstetrics and Gynecology  10/31/23   Randine Butcher, MD Consulting Physician Psychiatry  10/31/23     Objective  Vitals: BP 124/70   Pulse (!) 117   Ht 5' (1.524 m)   Wt 123 lb 12.8 oz (56.2 kg)   SpO2 98%   BMI 24.18 kg/m  General:  Well developed, well nourished, no acute distress  Psych:  Alert and orientedx3,normal mood and affect HEENT:  Normocephalic, atraumatic, non-icteric sclera, PERRL, supple neck without adenopathy, mass or thyromegaly Cardiovascular:  Normal S1, S2, RRR without gallop, rub or murmur Respiratory:  Good breath sounds bilaterally, CTAB with normal respiratory effort Gastrointestinal: normal bowel sounds, soft, non-tender, no noted masses. No HSM MSK: no deformities, contusions. Joints are without erythema or swelling.  Skin:  Warm, no rashes or suspicious lesions noted Neurologic:    Mental status is normal. Gross motor and sensory exams are normal. Normal gait. No tremor    Commons side effects, risks, benefits, and alternatives for medications and treatment plan prescribed today were discussed, and the patient expressed understanding of the given instructions. Patient is instructed to call or message via MyChart if he/she has any questions or concerns regarding our treatment plan. No barriers to understanding were identified. We discussed Red Flag symptoms and signs in detail. Patient expressed understanding regarding what to do in case of urgent or emergency type symptoms.  Medication list was reconciled, printed and provided to the patient in  AVS. Patient instructions and summary information was reviewed with the patient as documented in the AVS. This note was prepared with assistance of Dragon voice recognition software. Occasional wrong-word or sound-a-like substitutions may have occurred due to the inherent limitations of voice recognition software .

## 2023-10-31 NOTE — Patient Instructions (Signed)
 It was so good seeing you again! Thank you for establishing with my new practice and allowing me to continue caring for you. It means a lot to me.   Please schedule a follow up appointment with me in 12 months for complete physical  Steps to Quit Smoking Smoking tobacco is the leading cause of preventable death. It can affect almost every organ in the body. Smoking puts you and those around you at risk for developing many serious chronic diseases. Quitting smoking can be very challenging. Do not get discouraged if you are not successful the first time. Some people need to make many attempts to quit before they achieve long-term success. Do your best to stick to your quit plan, and talk with your health care provider if you have any questions or concerns. How do I get ready to quit? When you decide to quit smoking, create a plan to help you succeed. Before you quit: Pick a date to quit. Set a date within the next 2 weeks to give you time to prepare. Write down the reasons why you are quitting. Keep this list in places where you will see it often. Tell your family, friends, and co-workers that you are quitting. Support from people you are close to can make quitting easier. Talk with your health care provider about your options for quitting smoking. Find out what treatment options are covered by your health insurance. Identify people, places, things, and activities that make you want to smoke (triggers). Avoid them. What first steps can I take to quit smoking? Throw away all cigarettes at home, at work, and in your car. Throw away smoking accessories, such as Set designer. Clean your car. Make sure to empty the ashtray. Clean your home, including curtains and carpets. What strategies can I use to quit smoking? Talk with your health care provider about combining strategies, such as taking medicines while you are also receiving in-person counseling. Using these two strategies together makes  you more likely to succeed in quitting than if you used either strategy on its own. If you are pregnant or breastfeeding, talk with your health care provider about finding counseling or other support strategies to quit smoking. Do not take medicine to help you quit smoking unless your health care provider tells you to. Quit right away Quit smoking completely, instead of gradually reducing how much you smoke over a period of time. Stopping smoking right away may be more successful than gradually quitting. Attend in-person counseling to help you build problem-solving skills. You are more likely to succeed in quitting if you attend counseling sessions regularly. Even short sessions of 10 minutes can be effective. Take medicine You may take medicines to help you quit smoking. Some medicines require a prescription. You can also purchase over-the-counter medicines. Medicines may have nicotine  in them to replace the nicotine  in cigarettes. Medicines may: Help to stop cravings. Help to relieve withdrawal symptoms. Your health care provider may recommend: Nicotine  patches, gum, or lozenges. Nicotine  inhalers or sprays. Non-nicotine  medicine that you take by mouth. Find resources Find resources and support systems that can help you quit smoking and remain smoke-free after you quit. These resources are most helpful when you use them often. They include: Online chats with a Veterinary surgeon. Telephone quitlines. Printed Materials engineer. Support groups or group counseling. Text messaging programs. Mobile phone apps or applications. Use apps that can help you stick to your quit plan by providing reminders, tips, and encouragement. Examples of free services include Quit  Guide from the CDC and smokefree.gov  What can I do to make it easier to quit?  Reach out to your family and friends for support and encouragement. Call telephone quitlines, such as 1-800-QUIT-NOW, reach out to support groups, or work with a  counselor for support. Ask people who smoke to avoid smoking around you. Avoid places that trigger you to smoke, such as bars, parties, or smoke-break areas at work. Spend time with people who do not smoke. Lessen the stress in your life. Stress can be a smoking trigger for some people. To lessen stress, try: Exercising regularly. Doing deep-breathing exercises. Doing yoga. Meditating. What benefits will I see if I quit smoking? Over time, you should start to see positive results, such as: Improved sense of smell and taste. Decreased coughing and sore throat. Slower heart rate. Lower blood pressure. Clearer and healthier skin. The ability to breathe more easily. Fewer sick days. Summary Quitting smoking can be very challenging. Do not get discouraged if you are not successful the first time. Some people need to make many attempts to quit before they achieve long-term success. When you decide to quit smoking, create a plan to help you succeed. Quit smoking right away, not slowly over a period of time. Find resources and support systems that can help you quit smoking and remain smoke-free after you quit. This information is not intended to replace advice given to you by your health care provider. Make sure you discuss any questions you have with your health care provider. Document Revised: 05/13/2021 Document Reviewed: 05/13/2021 Elsevier Patient Education  2024 [employer].

## 2023-11-01 ENCOUNTER — Ambulatory Visit: Payer: Self-pay | Admitting: Family Medicine

## 2023-11-01 NOTE — Progress Notes (Signed)
 Labs reviewed.  The ASCVD Risk score (Arnett DK, et al., 2019) failed to calculate for the following reasons:   The 2019 ASCVD risk score is only valid for ages 60 to 65  Dear Kristin Gonzalez, Thank you for allowing me to care for you at your recent office visit.  I wanted to let you know that I have reviewed your lab test results and am happy to report that they are mostly good.  You are anemic and I recommend otc iron to improve this. As well, your vitamin D  is low: please take otc Vit D 2000 units daily.  We are waiting on the results of your TB screen.  It was so good seeing you again! Sincerely, Dr. Jonelle Neri

## 2023-11-02 LAB — HEPATITIS C ANTIBODY: Hepatitis C Ab: NONREACTIVE

## 2023-11-02 LAB — QUANTIFERON-TB GOLD PLUS
Mitogen-NIL: 9.6 [IU]/mL
NIL: 0.03 [IU]/mL
QuantiFERON-TB Gold Plus: NEGATIVE
TB1-NIL: 0.01 [IU]/mL
TB2-NIL: 0 [IU]/mL

## 2023-11-06 ENCOUNTER — Other Ambulatory Visit (HOSPITAL_BASED_OUTPATIENT_CLINIC_OR_DEPARTMENT_OTHER): Payer: Self-pay

## 2023-11-07 ENCOUNTER — Other Ambulatory Visit: Payer: Self-pay

## 2023-11-07 NOTE — Progress Notes (Signed)
 See mychart note Dear Ms. Labine, Your TB screen is negative Sincerely, Dr. Jonelle Neri

## 2023-11-14 DIAGNOSIS — F411 Generalized anxiety disorder: Secondary | ICD-10-CM | POA: Diagnosis not present

## 2023-11-14 DIAGNOSIS — F900 Attention-deficit hyperactivity disorder, predominantly inattentive type: Secondary | ICD-10-CM | POA: Diagnosis not present

## 2023-11-14 DIAGNOSIS — F320 Major depressive disorder, single episode, mild: Secondary | ICD-10-CM | POA: Diagnosis not present

## 2023-11-19 ENCOUNTER — Other Ambulatory Visit (HOSPITAL_BASED_OUTPATIENT_CLINIC_OR_DEPARTMENT_OTHER): Payer: Self-pay

## 2023-11-21 ENCOUNTER — Other Ambulatory Visit (HOSPITAL_BASED_OUTPATIENT_CLINIC_OR_DEPARTMENT_OTHER): Payer: Self-pay

## 2023-11-21 DIAGNOSIS — F411 Generalized anxiety disorder: Secondary | ICD-10-CM | POA: Diagnosis not present

## 2023-11-21 DIAGNOSIS — F320 Major depressive disorder, single episode, mild: Secondary | ICD-10-CM | POA: Diagnosis not present

## 2023-11-21 DIAGNOSIS — F900 Attention-deficit hyperactivity disorder, predominantly inattentive type: Secondary | ICD-10-CM | POA: Diagnosis not present

## 2023-11-21 MED ORDER — BUPROPION HCL 75 MG PO TABS
75.0000 mg | ORAL_TABLET | Freq: Every morning | ORAL | 0 refills | Status: DC
  Filled 2023-11-21: qty 30, 30d supply, fill #0

## 2023-11-23 ENCOUNTER — Other Ambulatory Visit (HOSPITAL_BASED_OUTPATIENT_CLINIC_OR_DEPARTMENT_OTHER): Payer: Self-pay

## 2023-12-06 ENCOUNTER — Other Ambulatory Visit (HOSPITAL_BASED_OUTPATIENT_CLINIC_OR_DEPARTMENT_OTHER): Payer: Self-pay

## 2023-12-06 ENCOUNTER — Ambulatory Visit: Payer: Self-pay

## 2023-12-06 DIAGNOSIS — F900 Attention-deficit hyperactivity disorder, predominantly inattentive type: Secondary | ICD-10-CM | POA: Diagnosis not present

## 2023-12-06 DIAGNOSIS — F411 Generalized anxiety disorder: Secondary | ICD-10-CM | POA: Diagnosis not present

## 2023-12-06 DIAGNOSIS — F320 Major depressive disorder, single episode, mild: Secondary | ICD-10-CM | POA: Diagnosis not present

## 2023-12-06 MED ORDER — BUPROPION HCL 75 MG PO TABS
75.0000 mg | ORAL_TABLET | Freq: Every morning | ORAL | 0 refills | Status: DC
  Filled 2023-12-06 – 2023-12-25 (×2): qty 30, 30d supply, fill #0

## 2023-12-06 NOTE — Telephone Encounter (Signed)
 FYI Only or Action Required?: FYI only for provider.  Patient was last seen in primary care on 10/31/2023 by Jodie Lavern CROME, MD. Called Nurse Triage reporting Shortness of Breath. Symptoms began several years ago. Interventions attempted: Nothing. Symptoms are: gradually worsening.  Triage Disposition: See PCP Within 2 Weeks  Patient/caregiver understands and will follow disposition?: Yes   Copied from CRM [id number]. Topic: Clinical - Red Word Triage >> Dec 06, 2023  3:51 PM Suzen RAMAN wrote: Red Word that prompted transfer to Nurse Triage: Occasional nerve pain and SOB with exertion   ----------------------------------------------------------------------- From previous Reason for Contact - Scheduling: Patient/patient representative is calling to schedule an appointment. Refer to attachments for appointment information. Reason for Disposition  [1] MILD longstanding difficulty breathing AND [2]  SAME as normal  Answer Assessment - Initial Assessment Questions 1. RESPIRATORY STATUS: Describe your breathing? (e.g., wheezing, shortness of breath, unable to speak, severe coughing)      Intermittent shortness of breath with long ambulation 2. ONSET: When did this breathing problem begin?      Ongoing for years but notices more frequently.   3. PATTERN Does the difficult breathing come and go, or has it been constant since it started?      Intermittent 4. SEVERITY: How bad is your breathing? (e.g., mild, moderate, severe)    - MILD: No SOB at rest, mild SOB with walking, speaks normally in sentences, can lie down, no retractions, pulse < 100.    - MODERATE: SOB at rest, SOB with minimal exertion and prefers to sit, cannot lie down flat, speaks in phrases, mild retractions, audible wheezing, pulse 100-120.    - SEVERE: Very SOB at rest, speaks in single words, struggling to breathe, sitting hunched forward, retractions, pulse > 120      Very mild. Not currently short of breath 5.  RECURRENT SYMPTOM: Have you had difficulty breathing before? If Yes, ask: When was the last time? and What happened that time?      yes  6. CAUSE: What do you think is causing the breathing problem?     ambulation 7. OTHER SYMPTOMS: Do you have any other symptoms? (e.g., dizziness, runny nose, cough, chest pain, fever)     Arm pain-has pinched cervical nerve dx on CT scan  Additional info: Patient requests to see pcp only.  Protocols used: Breathing Difficulty-A-AH

## 2023-12-17 ENCOUNTER — Ambulatory Visit: Admitting: Family Medicine

## 2023-12-17 ENCOUNTER — Ambulatory Visit (INDEPENDENT_AMBULATORY_CARE_PROVIDER_SITE_OTHER)

## 2023-12-17 ENCOUNTER — Ambulatory Visit: Payer: Self-pay | Admitting: Family Medicine

## 2023-12-17 VITALS — BP 120/80 | HR 71 | Temp 98.8°F | Ht 60.0 in | Wt 125.0 lb

## 2023-12-17 DIAGNOSIS — M79642 Pain in left hand: Secondary | ICD-10-CM

## 2023-12-17 DIAGNOSIS — R0609 Other forms of dyspnea: Secondary | ICD-10-CM

## 2023-12-17 DIAGNOSIS — M5412 Radiculopathy, cervical region: Secondary | ICD-10-CM | POA: Diagnosis not present

## 2023-12-17 DIAGNOSIS — M79641 Pain in right hand: Secondary | ICD-10-CM | POA: Diagnosis not present

## 2023-12-17 DIAGNOSIS — F17200 Nicotine dependence, unspecified, uncomplicated: Secondary | ICD-10-CM | POA: Diagnosis not present

## 2023-12-17 DIAGNOSIS — G5602 Carpal tunnel syndrome, left upper limb: Secondary | ICD-10-CM

## 2023-12-17 NOTE — Progress Notes (Signed)
 Subjective  CC:  Chief Complaint  Patient presents with   Numbness    Left arm and left leg and SOB on exertion and wants some imaging done and she is a smoker    HPI: Kristin Gonzalez is a 38 y.o. female who presents to the office today to address the problems listed above in the chief complaint. Discussed the use of AI scribe software for clinical note transcription with the patient, who gave verbal consent to proceed.  History of Present Illness Kristin Gonzalez is a 38 year old female who presents with numbness and tingling in her arms and legs, shortness of breath, and joint pain.  She experiences numbness and tingling sensations in her arms and legs, described as 'little pin prick' or 'fire ant' sensations. These occur randomly and are primarily in the distal arms and legs, but not in the hands and feet. Her left hand becomes numb when her elbow is down, a condition she has been living with for a while. An x-ray of her neck two years ago showed some arthritis, and a clinician suggested possible cervical radiculopathy, but no MRI was done. She experiences neck pain occasionally and shooting pain down the arm.  She switched from Adderall to Wellbutrin  to manage her anxiety, which she describes as 'off the chain'. She is currently taking Wellbutrin  75 mg once a day, as a genetic test indicated she metabolizes it slower. She takes it before sleep as it makes her sleepy. She is also trying to quit smoking and hopes Wellbutrin  will help with that.  She experiences shortness of breath with exercise and rigorous activities, noting that her stamina is not as good as it used to be. No chest tightness or wheezing, but after smoking, she can hear a 'smoky wheeze'. She has been smoking for 18 years.  She wakes up with sore joints, particularly in her left hand, but denies redness, heat, or swelling. She experiences difficulty with tasks like opening pill bottles due to joint pain. Her family history  includes arthritis, primarily osteoarthritis, with family members having 'bad knees'.  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 She and her husband use smoking as a time to connect away from their children.   Assessment  1. Cervical radiculopathy, chronic   2. Current every day smoker   3. DOE (dyspnea on exertion)   4. Pain in both hands   5. Left carpal tunnel syndrome      Plan  Assessment and Plan Assessment & Plan Cervical Radiculopathy vs carpal tunnely Intermittent left arm numbness and tingling. Differential includes carpal tunnel syndrome and cervical radiculopathy. - will consider nerve conduction studies on left hand for carpal tunnel syndrome. But will start with therapeutic trial: - Advise wrist splint at night for two weeks. - Recommend Advil , two tablets morning and night for two weeks.  Shortness of Breath Increased exertional dyspnea, possibly smoking-related. No wheezing or significant cough. Concerns about COPD due to 18 years of smoking. - Order chest x-ray to evaluate lung condition. - Encourage smoking cessation. - Advise 10-15 minutes of daily aerobic exercise. - further eval if worsens  Smoking Cessation Long-term smoker with current cessation efforts. Wellbutrin  used without significant change. Smoking linked to social habits and stress relief. - Discuss smoking cessation strategies, including routine changes and alternative stress relief. - Encourage nicotine  replacement therapy if needed. - Continue Wellbutrin  75 mg once daily.  Osteoarthritis Intermittent hand joint pain without redness, heat, or swelling. Family history suggests  osteoarthritis. - Order hand x-rays as baseline. - Conduct blood work including ANA and RA factor. - Recommend Tylenol  and Voltaren gel for joint pain.    Follow up: prn Orders Placed This Encounter  Procedures   DG Chest 2 View   DG Hand 2 View Right   DG Hand 2 View Left   Arthritis Panel   No orders of the  defined types were placed in this encounter.    I reviewed the patients updated PMH, FH, and SocHx.  Patient Active Problem List   Diagnosis Date Noted   Major depression, recurrent, chronic (HCC) 10/31/2023    Priority: High   Adjustment disorder with anxiety 10/31/2023    Priority: High   Cervical radiculopathy, chronic 04/06/2022    Priority: High   Mixed hyperlipidemia 06/23/2019    Priority: High   Current every day smoker 09/19/2013    Priority: High   Abnormal uterine bleeding (AUB) 07/06/2022    Priority: Low   Vitamin D  deficiency 04/06/2022    Priority: Low   Chronic eczematous otitis externa of both ears 03/09/2021    Priority: Low   Female pelvic congestion syndrome 10/31/2023   Attention deficit hyperactivity disorder (ADHD), combined type 10/31/2023   Family history of premature CAD 10/31/2023   Current Meds  Medication Sig   acetic acid -hydrocortisone  (VOSOL -HC) OTIC solution Place 3 to 4 drops in affected ear for 3 to 4 days until itching stops   buPROPion  (WELLBUTRIN  XL) 150 MG 24 hr tablet TAKE 1 TABLET BY MOUTH EVERY MORNING AFTER BREAKFAST   buPROPion  (WELLBUTRIN ) 75 MG tablet Take 1 tablet (75 mg total) by mouth every morning.   folic acid  (FOLVITE ) 1 MG tablet Take 1 tablet (1 mg total) by mouth daily as directed.   loratadine (CLARITIN) 10 MG tablet Take 10 mg by mouth as needed for allergies.   Vitamin D , Cholecalciferol, 50 MCG (2000 UT) CAPS Take by mouth.   Allergies: Patient has no known allergies. Family History: Patient family history includes Heart block in her maternal grandmother; Heart disease in her mother; Hyperlipidemia in her mother; Hypertension in her father and mother. Social History:  Patient  reports that she has been smoking cigarettes. She started smoking about 18 years ago. She has a 15.8 pack-year smoking history. She has never used smokeless tobacco. She reports that she does not drink alcohol  and does not use drugs.  Review of  Systems: Constitutional: Negative for fever malaise or anorexia Cardiovascular: negative for chest pain Respiratory: negative for SOB or persistent cough Gastrointestinal: negative for abdominal pain  Objective  Vitals: BP 120/80   Pulse 71   Temp 98.8 F (37.1 C)   Ht 5' (1.524 m)   Wt 125 lb (56.7 kg)   SpO2 98%   BMI 24.41 kg/m  General: no acute distress , A&Ox3 HEENT: PEERL, conjunctiva normal, neck is supple Cardiovascular:  RRR without murmur or gallop.  Respiratory:  Good breath sounds bilaterally, CTAB with normal respiratory effort Skin:  Warm, no rashes Joints: no swelling or redness or warmth.  + phalens' on left From neck Commons side effects, risks, benefits, and alternatives for medications and treatment plan prescribed today were discussed, and the patient expressed understanding of the given instructions. Patient is instructed to call or message via MyChart if he/she has any questions or concerns regarding our treatment plan. No barriers to understanding were identified. We discussed Red Flag symptoms and signs in detail. Patient expressed understanding regarding what to do in  case of urgent or emergency type symptoms.  Medication list was reconciled, printed and provided to the patient in AVS. Patient instructions and summary information was reviewed with the patient as documented in the AVS. This note was prepared with assistance of Dragon voice recognition software. Occasional wrong-word or sound-a-like substitutions may have occurred due to the inherent limitations of voice recognition software

## 2023-12-17 NOTE — Progress Notes (Signed)
 See mychart note Dear Ms. Passmore, Your hand xrays are also normal.  Sincerely, Dr. Jodie

## 2023-12-17 NOTE — Progress Notes (Signed)
 See mychart note Dear Ms. Pica, Your chest xray is normal. Sincerely, Dr. Jodie

## 2023-12-18 LAB — ARTHRITIS PANEL
Basophils Absolute: 0.1 x10E3/uL (ref 0.0–0.2)
Basos: 1 %
EOS (ABSOLUTE): 0.8 x10E3/uL — ABNORMAL HIGH (ref 0.0–0.4)
Eos: 8 %
Hematocrit: 36.5 % (ref 34.0–46.6)
Hemoglobin: 11.3 g/dL (ref 11.1–15.9)
Immature Grans (Abs): 0 x10E3/uL (ref 0.0–0.1)
Immature Granulocytes: 0 %
Lymphocytes Absolute: 4.6 x10E3/uL — ABNORMAL HIGH (ref 0.7–3.1)
Lymphs: 47 %
MCH: 26.1 pg — ABNORMAL LOW (ref 26.6–33.0)
MCHC: 31 g/dL — ABNORMAL LOW (ref 31.5–35.7)
MCV: 84 fL (ref 79–97)
Monocytes Absolute: 0.4 x10E3/uL (ref 0.1–0.9)
Monocytes: 4 %
Neutrophils Absolute: 3.8 x10E3/uL (ref 1.4–7.0)
Neutrophils: 40 %
Platelets: 232 x10E3/uL (ref 150–450)
RBC: 4.33 x10E6/uL (ref 3.77–5.28)
RDW: 13.7 % (ref 11.7–15.4)
Rheumatoid fact SerPl-aCnc: <10 [IU]/mL (ref <14.0)
Sed Rate: 3 mm/h (ref 0–32)
Uric Acid: 4.4 mg/dL (ref 2.6–6.2)
WBC: 9.7 x10E3/uL (ref 3.4–10.8)

## 2023-12-18 NOTE — Progress Notes (Signed)
 See mychart note Dear Ms. Im, Your arthritis panel is negative for RA or inflammatory markers. This is good news. Sincerely, Dr. Jodie

## 2023-12-25 ENCOUNTER — Other Ambulatory Visit (HOSPITAL_BASED_OUTPATIENT_CLINIC_OR_DEPARTMENT_OTHER): Payer: Self-pay

## 2024-01-07 ENCOUNTER — Other Ambulatory Visit (HOSPITAL_BASED_OUTPATIENT_CLINIC_OR_DEPARTMENT_OTHER): Payer: Self-pay

## 2024-01-07 DIAGNOSIS — F900 Attention-deficit hyperactivity disorder, predominantly inattentive type: Secondary | ICD-10-CM | POA: Diagnosis not present

## 2024-01-07 DIAGNOSIS — F411 Generalized anxiety disorder: Secondary | ICD-10-CM | POA: Diagnosis not present

## 2024-01-07 DIAGNOSIS — F320 Major depressive disorder, single episode, mild: Secondary | ICD-10-CM | POA: Diagnosis not present

## 2024-01-07 MED ORDER — BUPROPION HCL 75 MG PO TABS
75.0000 mg | ORAL_TABLET | Freq: Two times a day (BID) | ORAL | 0 refills | Status: DC
  Filled 2024-02-05: qty 6, 3d supply, fill #0
  Filled 2024-02-05: qty 54, 27d supply, fill #0

## 2024-02-05 ENCOUNTER — Other Ambulatory Visit (HOSPITAL_BASED_OUTPATIENT_CLINIC_OR_DEPARTMENT_OTHER): Payer: Self-pay

## 2024-02-11 ENCOUNTER — Other Ambulatory Visit (HOSPITAL_BASED_OUTPATIENT_CLINIC_OR_DEPARTMENT_OTHER): Payer: Self-pay

## 2024-02-11 DIAGNOSIS — F411 Generalized anxiety disorder: Secondary | ICD-10-CM | POA: Diagnosis not present

## 2024-02-11 DIAGNOSIS — F900 Attention-deficit hyperactivity disorder, predominantly inattentive type: Secondary | ICD-10-CM | POA: Diagnosis not present

## 2024-02-11 DIAGNOSIS — F320 Major depressive disorder, single episode, mild: Secondary | ICD-10-CM | POA: Diagnosis not present

## 2024-02-11 MED ORDER — BUPROPION HCL 75 MG PO TABS
75.0000 mg | ORAL_TABLET | Freq: Two times a day (BID) | ORAL | 1 refills | Status: DC
  Filled 2024-02-11: qty 60, 30d supply, fill #0

## 2024-03-12 ENCOUNTER — Other Ambulatory Visit (HOSPITAL_BASED_OUTPATIENT_CLINIC_OR_DEPARTMENT_OTHER): Payer: Self-pay

## 2024-03-12 ENCOUNTER — Other Ambulatory Visit: Payer: Self-pay

## 2024-03-12 DIAGNOSIS — F411 Generalized anxiety disorder: Secondary | ICD-10-CM | POA: Diagnosis not present

## 2024-03-12 DIAGNOSIS — F900 Attention-deficit hyperactivity disorder, predominantly inattentive type: Secondary | ICD-10-CM | POA: Diagnosis not present

## 2024-03-12 DIAGNOSIS — F320 Major depressive disorder, single episode, mild: Secondary | ICD-10-CM | POA: Diagnosis not present

## 2024-03-12 MED ORDER — PROPRANOLOL HCL 10 MG PO TABS
5.0000 mg | ORAL_TABLET | Freq: Two times a day (BID) | ORAL | 0 refills | Status: DC
  Filled 2024-03-12: qty 30, 30d supply, fill #0

## 2024-03-13 ENCOUNTER — Other Ambulatory Visit (HOSPITAL_BASED_OUTPATIENT_CLINIC_OR_DEPARTMENT_OTHER): Payer: Self-pay

## 2024-03-18 ENCOUNTER — Ambulatory Visit
Admission: EM | Admit: 2024-03-18 | Discharge: 2024-03-18 | Disposition: A | Discharge status: Home / Self Care | Attending: Physician Assistant | Admitting: Physician Assistant

## 2024-03-18 ENCOUNTER — Other Ambulatory Visit (HOSPITAL_BASED_OUTPATIENT_CLINIC_OR_DEPARTMENT_OTHER): Payer: Self-pay

## 2024-03-18 ENCOUNTER — Other Ambulatory Visit: Payer: Self-pay

## 2024-03-18 VITALS — BP 121/79 | HR 87 | Temp 98.9°F | Resp 18 | Ht 60.0 in | Wt 125.0 lb

## 2024-03-18 DIAGNOSIS — J209 Acute bronchitis, unspecified: Secondary | ICD-10-CM | POA: Diagnosis not present

## 2024-03-18 DIAGNOSIS — R0989 Other specified symptoms and signs involving the circulatory and respiratory systems: Secondary | ICD-10-CM | POA: Diagnosis not present

## 2024-03-18 MED ORDER — AEROCHAMBER PLUS FLO-VU MEDIUM MISC
1.0000 | Freq: Once | Status: AC
  Administered 2024-03-18: 1

## 2024-03-18 MED ORDER — ALBUTEROL SULFATE HFA 108 (90 BASE) MCG/ACT IN AERS
1.0000 | INHALATION_SPRAY | Freq: Four times a day (QID) | RESPIRATORY_TRACT | 0 refills | Status: AC | PRN
  Filled 2024-03-18: qty 6.7, 25d supply, fill #0

## 2024-03-18 NOTE — ED Provider Notes (Signed)
 [facility]    CSN: [id number] Arrival date & time: 03/18/24  1619      History   Chief Complaint Chief Complaint  Patient presents with   Fatigue    Cough, flu and Covid negative - Entered by patient    HPI Kristin Gonzalez is a 38 y.o. female.  has a past medical history of ADD (attention deficit disorder), Anxiety, Depression, Postpartum care following cesarean delivery (8/28) (01/30/2013), Postpartum care following cesarean delivery (9/23) (02/26/2015), and Vitamin D  deficiency.   HPI  Discussed the use of AI scribe software for clinical note transcription with the patient, who gave verbal consent to proceed.  The patient presents with fatigue, joint pain, and cough.  Fatigue began on Friday after a busy week and working night shifts. It is described as generalized tiredness and has not improved despite adequate sleep over the last two days.  Joint pain and cough started on Monday. The cough is productive of yellow sputum and worsens when lying flat, causing shortness of breath. COVID and flu tests were negative. She experiences chills but no fever. A headache occurred last night and was relieved by Tylenol , which has not been taken today.  Nasal congestion is present without runny nose, sore throat, or ear pain. She uses Flonase  twice daily and takes loratadine as usual. Increased intake of vitamins C, B12, D, and iron has resulted in constipation.  She smokes and has experienced increased shortness of breath on exertion over the past few months.  No nausea, vomiting, or diarrhea. Menstrual period started yesterday, with prior abdominal pain attributed to its onset.  Tenderness is reported on the right side of the face, in front of the ear. Appetite is present, but weakness affects meal preparation.    Past Medical History:  Diagnosis Date   ADD (attention deficit disorder)    Anxiety    Depression    Postpartum care following cesarean delivery (8/28)  01/30/2013   Postpartum care following cesarean delivery (9/23) 02/26/2015   Vitamin D  deficiency     Patient Active Problem List   Diagnosis Date Noted   Major depression, recurrent, chronic 10/31/2023   Adjustment disorder with anxiety 10/31/2023   Female pelvic congestion syndrome 10/31/2023   Attention deficit hyperactivity disorder (ADHD), combined type 10/31/2023   Family history of premature CAD 10/31/2023   Abnormal uterine bleeding (AUB) 07/06/2022   Cervical radiculopathy, chronic 04/06/2022   Vitamin D  deficiency 04/06/2022   Chronic eczematous otitis externa of both ears 03/09/2021   Mixed hyperlipidemia 06/23/2019   Current every day smoker 09/19/2013    Past Surgical History:  Procedure Laterality Date   CESAREAN SECTION N/A 01/30/2013   Procedure: CESAREAN SECTION;  Surgeon: Robbi JONELLE Render, MD;  Location: [facility];  Service: Obstetrics;  Laterality: N/A;   CESAREAN SECTION N/A 02/26/2015   Procedure: Repeat CESAREAN SECTION;  Surgeon: Dickie Carder, MD;  Location: [facility];  Service: Obstetrics;  Laterality: N/A;   CESAREAN SECTION N/A 10/24/2017   Procedure: Repeat CESAREAN SECTION;  Surgeon: Carder Dickie, MD;  Location: [facility];  Service: Obstetrics;  Laterality: N/A;  EDD: 10/30/17   IUD REMOVAL N/A 08/28/2019   Procedure: INTRAUTERINE DEVICE (IUD) REMOVAL;  Surgeon: Render Robbi, MD;  Location: [facility];  Service: Gynecology;  Laterality: N/A;   LAPAROSCOPIC TUBAL LIGATION Bilateral 08/28/2019   Procedure: LAPAROSCOPIC TUBAL LIGATION BY SALPINGECTOMY;  Surgeon: Render Robbi, MD;  Location: [facility];  Service: Gynecology;  Laterality: Bilateral;  WISDOM TOOTH EXTRACTION  2018    OB History     Gravida  5   Para  3   Term  3   Preterm      AB  2   Living  3      SAB      IAB  2   Ectopic      Multiple  0   Live Births  3            Home Medications    Prior to Admission  medications   Medication Sig Start Date End Date Taking? Authorizing Provider  albuterol (VENTOLIN HFA) 108 (90 Base) MCG/ACT inhaler Inhale 1-2 puffs into the lungs every 6 (six) hours as needed for wheezing or shortness of breath. 03/18/24  Yes Wessley Emert E, PA-C  acetic acid -hydrocortisone  (VOSOL -HC) OTIC solution Place 3 to 4 drops in affected ear for 3 to 4 days until itching stops 02/10/20   Pauline Garnette LABOR, MD  buPROPion  (WELLBUTRIN  XL) 150 MG 24 hr tablet TAKE 1 TABLET BY MOUTH EVERY MORNING AFTER BREAKFAST 04/05/20 12/17/23  Akintayo, Mojeed, MD  buPROPion  (WELLBUTRIN ) 75 MG tablet Take 1 tablet (75 mg total) by mouth 2 (two) times daily (second around 1 pm) 02/11/24     fluticasone  (FLONASE ) 50 MCG/ACT nasal spray Place 2 sprays into both nostrils daily. Patient not taking: Reported on 12/17/2023 02/23/20   Lavell Lye A, FNP  folic acid  (FOLVITE ) 1 MG tablet Take 1 tablet (1 mg total) by mouth daily as directed. 09/11/23     loratadine (CLARITIN) 10 MG tablet Take 10 mg by mouth as needed for allergies.    [provider]  propranolol (INDERAL) 10 MG tablet Take 0.5 tablets (5 mg total) by mouth 2 (two) times daily. 03/12/24     Vilazodone  HCl (VIIBRYD ) 10 MG TABS Take 1 tablet (10 mg total) by mouth daily. Patient not taking: Reported on 12/17/2023 10/17/23     Vitamin D , Cholecalciferol, 50 MCG (2000 UT) CAPS Take by mouth.    [provider]  cloNIDine  (CATAPRES ) 0.1 MG tablet Take 1 tablet (0.1 mg total) by mouth daily as needed for anxiety 04/11/23 06/08/23    lamoTRIgine  (LAMICTAL ) 25 MG tablet Take 1 tablet (25 mg total) by mouth at bedtime. 06/08/23 06/26/23      Family History Family History  Problem Relation Age of Onset   Hypertension Mother    Heart disease Mother    Hyperlipidemia Mother    Hypertension Father    Heart block Maternal Grandmother    Stroke Neg Hx    Diabetes Neg Hx     Social History Social History   Tobacco Use   Smoking status: Every Day     Current packs/day: 0.50    Average packs/day: 0.8 packs/day for 18.8 years (15.9 ttl pk-yrs)    Types: Cigarettes    Start date: 06/05/2005   Smokeless tobacco: Never  Vaping Use   Vaping status: Some Days  Substance Use Topics   Alcohol  use: No   Drug use: No     Allergies   Patient has no known allergies.   Review of Systems Review of Systems  Constitutional:  Positive for chills and fatigue. Negative for appetite change and fever.  HENT:  Positive for congestion and postnasal drip. Negative for ear pain, rhinorrhea and sore throat.   Respiratory:  Positive for cough. Negative for chest tightness, shortness of breath and wheezing.   Gastrointestinal:  Positive for  abdominal pain and constipation. Negative for diarrhea, nausea and vomiting.  Musculoskeletal:  Positive for arthralgias and myalgias.  Skin:  Negative for rash.  Neurological:  Positive for headaches. Negative for dizziness and light-headedness.     Physical Exam Triage Vital Signs ED Triage Vitals  Encounter Vitals Group     BP 03/18/24 1651 121/79     Girls Systolic BP Percentile --      Girls Diastolic BP Percentile --      Boys Systolic BP Percentile --      Boys Diastolic BP Percentile --      Pulse Rate 03/18/24 1651 87     Resp 03/18/24 1651 18     Temp 03/18/24 1651 98.9 F (37.2 C)     Temp Source 03/18/24 1651 Oral     SpO2 03/18/24 1651 97 %     Weight 03/18/24 1653 125 lb (56.7 kg)     Height 03/18/24 1653 5' (1.524 m)     Head Circumference --      Peak Flow --      Pain Score 03/18/24 1653 0     Pain Loc --      Pain Education --      Exclude from Growth Chart --    No data found.  Updated Vital Signs BP 121/79 (BP Location: Right Arm)   Pulse 87   Temp 98.9 F (37.2 C) (Oral)   Resp 18   Ht 5' (1.524 m)   Wt 125 lb (56.7 kg)   LMP 03/17/2024 (Exact Date)   SpO2 97%   BMI 24.41 kg/m   Visual Acuity Right Eye Distance:   Left Eye Distance:   Bilateral Distance:     Right Eye Near:   Left Eye Near:    Bilateral Near:     Physical Exam Vitals reviewed.  Constitutional:      General: She is awake.     Appearance: Normal appearance. She is well-developed and well-groomed.  HENT:     Head: Normocephalic and atraumatic.     Right Ear: Hearing, tympanic membrane and ear canal normal.     Left Ear: Hearing, tympanic membrane and ear canal normal.     Mouth/Throat:     Lips: Pink.     Mouth: Mucous membranes are moist.     Pharynx: Oropharynx is clear. Uvula midline. No pharyngeal swelling, oropharyngeal exudate, posterior oropharyngeal erythema, uvula swelling or postnasal drip.  Cardiovascular:     Rate and Rhythm: Normal rate and regular rhythm.     Pulses: Normal pulses.          Radial pulses are 2+ on the right side and 2+ on the left side.     Heart sounds: Normal heart sounds. No murmur heard.    No friction rub. No gallop.  Pulmonary:     Effort: Pulmonary effort is normal.     Breath sounds: Normal breath sounds. No decreased air movement. No decreased breath sounds, wheezing, rhonchi or rales.  Musculoskeletal:     Cervical back: Normal range of motion and neck supple.  Lymphadenopathy:     Head:     Right side of head: No submental, submandibular or preauricular adenopathy.     Left side of head: No submental, submandibular or preauricular adenopathy.     Cervical:     Right cervical: No superficial cervical adenopathy.    Left cervical: No superficial cervical adenopathy.     Upper Body:     Right  upper body: No supraclavicular adenopathy.     Left upper body: No supraclavicular adenopathy.  Neurological:     Mental Status: She is alert.  Psychiatric:        Mood and Affect: Mood normal.        Behavior: Behavior normal. Behavior is cooperative.        Thought Content: Thought content normal.        Judgment: Judgment normal.      UC Treatments / Results  Labs (all labs ordered are listed, but only abnormal results are  displayed) Labs Reviewed - No data to display  EKG   Radiology No results found.  Procedures Procedures (including critical care time)  Medications Ordered in UC Medications  AeroChamber Plus Flo-Vu Medium MISC 1 each (1 each Other Given by Other 03/18/24 1755)    Initial Impression / Assessment and Plan / UC Course  I have reviewed the triage vital signs and the nursing notes.  Pertinent labs & imaging results that were available during my care of the patient were reviewed by me and considered in my medical decision making (see chart for details).      Final Clinical Impressions(s) / UC Diagnoses   Final diagnoses:  Acute bronchitis, unspecified organism  Symptoms of upper respiratory infection (URI)   Acute bronchitis Likely secondary to a viral infection, presenting with fatigue, joint pain, cough, and shortness of breath. Negative for COVID-19 and influenza. Symptoms exacerbated by lying flat. No fever or significant chills. Oxygen saturation is normal. Differential includes viral bronchitis and possible pneumonia, but lack of high fever and normal oxygen saturation make pneumonia less likely. Increased work of breathing may contribute to fatigue. - Prescribed albuterol inhaler with spacer for bronchospasm relief. - Recommended Mucinex to thin mucus. - Continue Flonase  and vitamins. - Add Robitussin for cough management. - Advised supportive care and symptom management.  Tobacco use Current smoker with increased shortness of breath during exertion. No history of asthma or COPD. Plans to see a pulmonologist for further evaluation. - Continue with scheduled pulmonology appointment for further evaluation. - Advised to discuss current symptoms and inhaler use with pulmonologist.     Discharge Instructions      VISIT SUMMARY:  You came in today with symptoms of fatigue, joint pain, and a productive cough. After a thorough evaluation, it was determined that you  likely have acute bronchitis, which is an inflammation of the airways usually caused by a viral infection. Your COVID-19 and flu tests were negative, and your oxygen levels are normal. We also discussed your tobacco use and the increased shortness of breath you have been experiencing during physical activity.  YOUR PLAN:  -ACUTE BRONCHITIS: Acute bronchitis is an inflammation of the airways usually caused by a viral infection. You have been prescribed an albuterol inhaler with a spacer to help relieve bronchospasms. You should also take Mucinex to thin the mucus and Robitussin to manage your cough. Continue using Flonase  and your vitamins as usual. Supportive care and symptom management are advised.  -TOBACCO USE: Smoking can contribute to increased shortness of breath and other respiratory issues. You should continue with your scheduled appointment with the pulmonologist to further evaluate your symptoms and discuss your inhaler use.  INSTRUCTIONS:  Please follow up with your pulmonologist as scheduled to discuss your current symptoms and inhaler use. Continue taking the prescribed medications and follow the supportive care recommendations provided.     ED Prescriptions     Medication Sig Dispense Auth.  Provider   albuterol (VENTOLIN HFA) 108 (90 Base) MCG/ACT inhaler Inhale 1-2 puffs into the lungs every 6 (six) hours as needed for wheezing or shortness of breath. 6.7 g Islay Polanco E, PA-C      PDMP not reviewed this encounter.   Marylene Rocky BRAVO, PA-C 03/18/24 1815

## 2024-03-18 NOTE — ED Triage Notes (Signed)
 Pt presents with a chief complaint of fatigue x 5 days. Fatigue is accompanied with cough, congestion, shortness of breath, wheezing, joint pain, and chills. Denies pain at this time. OTC Claritin + Flonase  taken at home with improvement/relief. At-home COVID + Flu test taken last night and it was negative. Scheduled for pulmonologist appointment tomorrow at 1030.

## 2024-03-18 NOTE — Discharge Instructions (Addendum)
 VISIT SUMMARY:  You came in today with symptoms of fatigue, joint pain, and a productive cough. After a thorough evaluation, it was determined that you likely have acute bronchitis, which is an inflammation of the airways usually caused by a viral infection. Your COVID-19 and flu tests were negative, and your oxygen levels are normal. We also discussed your tobacco use and the increased shortness of breath you have been experiencing during physical activity.  YOUR PLAN:  -ACUTE BRONCHITIS: Acute bronchitis is an inflammation of the airways usually caused by a viral infection. You have been prescribed an albuterol inhaler with a spacer to help relieve bronchospasms. You should also take Mucinex to thin the mucus and Robitussin to manage your cough. Continue using Flonase  and your vitamins as usual. Supportive care and symptom management are advised.  -TOBACCO USE: Smoking can contribute to increased shortness of breath and other respiratory issues. You should continue with your scheduled appointment with the pulmonologist to further evaluate your symptoms and discuss your inhaler use.  INSTRUCTIONS:  Please follow up with your pulmonologist as scheduled to discuss your current symptoms and inhaler use. Continue taking the prescribed medications and follow the supportive care recommendations provided.

## 2024-03-19 ENCOUNTER — Other Ambulatory Visit (HOSPITAL_BASED_OUTPATIENT_CLINIC_OR_DEPARTMENT_OTHER): Payer: Self-pay

## 2024-03-19 ENCOUNTER — Encounter: Payer: Self-pay | Admitting: Pulmonary Disease

## 2024-03-19 ENCOUNTER — Ambulatory Visit: Admitting: Pulmonary Disease

## 2024-03-19 VITALS — BP 115/79 | HR 85 | Temp 98.2°F | Ht 59.5 in | Wt 126.2 lb

## 2024-03-19 DIAGNOSIS — R0602 Shortness of breath: Secondary | ICD-10-CM | POA: Diagnosis not present

## 2024-03-19 MED ORDER — VARENICLINE TARTRATE 1 MG PO TABS
1.0000 mg | ORAL_TABLET | Freq: Two times a day (BID) | ORAL | 3 refills | Status: DC
  Filled 2024-03-19: qty 60, 30d supply, fill #0

## 2024-03-19 MED ORDER — VARENICLINE TARTRATE 0.5 MG PO TABS
0.5000 mg | ORAL_TABLET | Freq: Two times a day (BID) | ORAL | 0 refills | Status: AC
  Filled 2024-03-19: qty 14, 7d supply, fill #0

## 2024-03-19 NOTE — Progress Notes (Signed)
 Kristin Gonzalez    [id number]    [birth year]  Primary Care Physician:Andy, Lavern CROME, MD  Referring Physician: Jodie Lavern CROME, MD [address] [place],  [place] [address]  Chief complaint:   Patient being seen for cough, shortness of breath  HPI:  Recently seen in urgent care for cough, bronchitis  Active smoker Trying to quit  Stress does make her smoke Background history of anxiety  Shortness of breath with exertion Chronic cough  Currently smokes about half a pack a day  She is active, knows what to do just has not been able to put it in place because of stress  Nursing, in school for doctorate  Outpatient Encounter Medications as of 03/19/2024  Medication Sig   albuterol (VENTOLIN HFA) 108 (90 Base) MCG/ACT inhaler Inhale 1-2 puffs into the lungs every 6 (six) hours as needed for wheezing or shortness of breath.   fluticasone  (FLONASE ) 50 MCG/ACT nasal spray Place 2 sprays into both nostrils daily.   folic acid  (FOLVITE ) 1 MG tablet Take 1 tablet (1 mg total) by mouth daily as directed.   loratadine (CLARITIN) 10 MG tablet Take 10 mg by mouth as needed for allergies.   propranolol (INDERAL) 10 MG tablet Take 0.5 tablets (5 mg total) by mouth 2 (two) times daily.   Vitamin D , Cholecalciferol, 50 MCG (2000 UT) CAPS Take by mouth.   [DISCONTINUED] acetic acid -hydrocortisone  (VOSOL -HC) OTIC solution Place 3 to 4 drops in affected ear for 3 to 4 days until itching stops   [DISCONTINUED] buPROPion  (WELLBUTRIN  XL) 150 MG 24 hr tablet TAKE 1 TABLET BY MOUTH EVERY MORNING AFTER BREAKFAST   [DISCONTINUED] buPROPion  (WELLBUTRIN ) 75 MG tablet Take 1 tablet (75 mg total) by mouth 2 (two) times daily (second around 1 pm)   [DISCONTINUED] cloNIDine  (CATAPRES ) 0.1 MG tablet Take 1 tablet (0.1 mg total) by mouth daily as needed for anxiety   [DISCONTINUED] lamoTRIgine  (LAMICTAL ) 25 MG tablet Take 1 tablet (25 mg total) by mouth at bedtime.   [DISCONTINUED] Vilazodone  HCl  (VIIBRYD ) 10 MG TABS Take 1 tablet (10 mg total) by mouth daily. (Patient not taking: Reported on 12/17/2023)   No facility-administered encounter medications on file as of 03/19/2024.    Allergies as of 03/19/2024   (No Known Allergies)    Past Medical History:  Diagnosis Date   ADD (attention deficit disorder)    Anxiety    Depression    Postpartum care following cesarean delivery (8/28) 01/30/2013   Postpartum care following cesarean delivery (9/23) 02/26/2015   Vitamin D  deficiency     Past Surgical History:  Procedure Laterality Date   CESAREAN SECTION N/A 01/30/2013   Procedure: CESAREAN SECTION;  Surgeon: Robbi JONELLE Render, MD;  Location: [facility];  Service: Obstetrics;  Laterality: N/A;   CESAREAN SECTION N/A 02/26/2015   Procedure: Repeat CESAREAN SECTION;  Surgeon: Dickie Carder, MD;  Location: [facility];  Service: Obstetrics;  Laterality: N/A;   CESAREAN SECTION N/A 10/24/2017   Procedure: Repeat CESAREAN SECTION;  Surgeon: Carder Dickie, MD;  Location: [facility];  Service: Obstetrics;  Laterality: N/A;  EDD: 10/30/17   IUD REMOVAL N/A 08/28/2019   Procedure: INTRAUTERINE DEVICE (IUD) REMOVAL;  Surgeon: Render Robbi, MD;  Location: [facility];  Service: Gynecology;  Laterality: N/A;   LAPAROSCOPIC TUBAL LIGATION Bilateral 08/28/2019   Procedure: LAPAROSCOPIC TUBAL LIGATION BY SALPINGECTOMY;  Surgeon: Render Robbi, MD;  Location: [facility];  Service: Gynecology;  Laterality: Bilateral;   WISDOM TOOTH EXTRACTION  2018    Family History  Problem Relation Age of Onset   Hypertension Mother    Heart disease Mother    Hyperlipidemia Mother    Hypertension Father    Heart block Maternal Grandmother    Stroke Neg Hx    Diabetes Neg Hx     Social History   Socioeconomic History   Marital status: Married    Spouse name: Not on file   Number of children: 3   Years of education: Not on file   Highest education level: Not on  file  Occupational History   Not on file  Tobacco Use   Smoking status: Every Day    Current packs/day: 0.50    Average packs/day: 0.8 packs/day for 18.8 years (15.9 ttl pk-yrs)    Types: Cigarettes    Start date: 06/05/2005   Smokeless tobacco: Never   Tobacco comments:    1/2 pack a day 03/19/2024 KRD  Vaping Use   Vaping status: Some Days  Substance and Sexual Activity   Alcohol  use: No   Drug use: No   Sexual activity: Yes    Birth control/protection: Surgical    Comment: Tubal ligation  Other Topics Concern   Not on file  Social History Narrative   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   Came to [place]  age 38, born in [place]   Married   2 daughters, 1 son   2 cats   Social Drivers of Corporate investment banker Strain: Not on file  Food Insecurity: Not on file  Transportation Needs: Not on file  Physical Activity: Inactive (06/20/2019)   Exercise Vital Sign    Days of Exercise per Week: 0 days    Minutes of Exercise per Session: 0 min  Stress: Not on file  Social Connections: Not on file  Intimate Partner Violence: Not At Risk (06/20/2019)   Humiliation, Afraid, Rape, and Kick questionnaire    Fear of Current or Ex-Partner: No    Emotionally Abused: No    Physically Abused: No    Sexually Abused: No    Review of Systems  Respiratory:  Positive for cough and shortness of breath.     Vitals:   03/19/24 1039  BP: 115/79  Pulse: 85  Temp: 98.2 F (36.8 C)  SpO2: 97%     Physical Exam Constitutional:      Appearance: She is obese.  HENT:     Head: Normocephalic.     Mouth/Throat:     Mouth: Mucous membranes are moist.  Eyes:     General: No scleral icterus.    Pupils: Pupils are equal, round, and reactive to light.  Cardiovascular:     Rate and Rhythm: Normal rate and regular rhythm.     Heart sounds: No murmur heard.    No friction rub.  Pulmonary:     Effort: No respiratory distress.     Breath sounds: No stridor. No wheezing, rhonchi or rales.   Musculoskeletal:     Cervical back: No rigidity or tenderness.  Lymphadenopathy:     Cervical: No cervical adenopathy.  Neurological:     Mental Status: She is alert.  Psychiatric:        Mood and Affect: Mood normal.    Data Reviewed: Chest x-ray 12/17/2023 - Reviewed by myself - Shows no acute infiltrate  Records from recent visit to urgent care reviewed - Tested negative  Assessment/Plan:  Cough, shortness of breath  Nicotine  addiction  Shortness of breath, likely related to recent bronchitis  Talked at length about the need to quit smoking - She is knowledgeable about risks about continuing to smoke, just has a difficult time with navigating quitting with the associated stress  Ordered a pulmonary function test  She does have albuterol to use as needed  Ordering for Chantix starter pack - Maintenance Chantix also ordered  She does have nicotine  patches at home to start  I did encourage her to make a contact with herself and choose a date to quit  Follow-up in about 2-3 months  Jennet Epley MD [facility] 03/19/2024, 10:42 AM  CC: Jodie Lavern CROME, MD

## 2024-03-19 NOTE — Patient Instructions (Addendum)
 I will see you in about 2 to 3 months  We will schedule you for breathing study  Continue using albuterol as needed  We will place a prescription for Chantix  Nicotine  patches as tolerated  Quit smoking by Dasie Myron is the book we looked up

## 2024-03-21 DIAGNOSIS — J40 Bronchitis, not specified as acute or chronic: Secondary | ICD-10-CM | POA: Diagnosis not present

## 2024-03-31 ENCOUNTER — Other Ambulatory Visit (HOSPITAL_BASED_OUTPATIENT_CLINIC_OR_DEPARTMENT_OTHER): Payer: Self-pay

## 2024-03-31 MED ORDER — FLUZONE 0.5 ML IM SUSY
0.5000 mL | PREFILLED_SYRINGE | Freq: Once | INTRAMUSCULAR | 0 refills | Status: AC
  Filled 2024-03-31: qty 0.5, 1d supply, fill #0

## 2024-04-11 DIAGNOSIS — F411 Generalized anxiety disorder: Secondary | ICD-10-CM | POA: Diagnosis not present

## 2024-04-11 DIAGNOSIS — F320 Major depressive disorder, single episode, mild: Secondary | ICD-10-CM | POA: Diagnosis not present

## 2024-04-11 DIAGNOSIS — F900 Attention-deficit hyperactivity disorder, predominantly inattentive type: Secondary | ICD-10-CM | POA: Diagnosis not present

## 2024-06-18 ENCOUNTER — Encounter: Payer: Self-pay | Admitting: Pulmonary Disease

## 2024-06-18 ENCOUNTER — Ambulatory Visit

## 2024-06-18 ENCOUNTER — Ambulatory Visit: Admitting: Pulmonary Disease

## 2024-06-18 VITALS — BP 106/64 | HR 78 | Temp 97.7°F | Ht 59.5 in | Wt 128.2 lb

## 2024-06-18 DIAGNOSIS — F17210 Nicotine dependence, cigarettes, uncomplicated: Secondary | ICD-10-CM | POA: Diagnosis not present

## 2024-06-18 DIAGNOSIS — R0602 Shortness of breath: Secondary | ICD-10-CM

## 2024-06-18 DIAGNOSIS — R059 Cough, unspecified: Secondary | ICD-10-CM | POA: Diagnosis not present

## 2024-06-18 LAB — PULMONARY FUNCTION TEST
DL/VA % pred: 110 %
DL/VA: 5.17 ml/min/mmHg/L
DLCO unc % pred: 92 %
DLCO unc: 17.55 ml/min/mmHg
FEF 25-75 Post: 4.01 L/s
FEF 25-75 Pre: 3.13 L/s
FEF2575-%Change-Post: 28 %
FEF2575-%Pred-Post: 133 %
FEF2575-%Pred-Pre: 103 %
FEV1-%Change-Post: 5 %
FEV1-%Pred-Post: 87 %
FEV1-%Pred-Pre: 82 %
FEV1-Post: 2.33 L
FEV1-Pre: 2.21 L
FEV1FVC-%Change-Post: 5 %
FEV1FVC-%Pred-Pre: 104 %
FEV6-%Change-Post: 0 %
FEV6-%Pred-Post: 80 %
FEV6-%Pred-Pre: 80 %
FEV6-Post: 2.54 L
FEV6-Pre: 2.55 L
FEV6FVC-%Pred-Post: 101 %
FEV6FVC-%Pred-Pre: 101 %
FVC-%Change-Post: 0 %
FVC-%Pred-Post: 79 %
FVC-%Pred-Pre: 79 %
FVC-Post: 2.54 L
FVC-Pre: 2.55 L
Post FEV1/FVC ratio: 92 %
Post FEV6/FVC ratio: 100 %
Pre FEV1/FVC ratio: 87 %
Pre FEV6/FVC Ratio: 100 %
RV % pred: 54 %
RV: 0.7 L
TLC % pred: 72 %
TLC: 3.18 L

## 2024-06-18 NOTE — Patient Instructions (Signed)
 Full pft performed today

## 2024-06-18 NOTE — Progress Notes (Signed)
 Full pft performed today

## 2024-06-18 NOTE — Progress Notes (Signed)
 "              Kristin Gonzalez    [id number]    01/08/1986  Primary Care Physician:Andy, Lavern CROME, MD  Referring Physician: Jodie Lavern CROME, MD [address] [place],  [place] [address]  Chief complaint:   Follow-up for cough, shortness of breath   HPI:  Symptoms have improved significantly Cough and congestion is better Not actively using inhaler  Continues to try to quit cigarette, was able to stay off for a while but stress led to going back to a few cigarettes a day Chantix  did help but did not want to keep taking Chantix  History of anxiety  No cough, no significant shortness of breath  Remains very active  Nursing, in school for doctorate  Outpatient Encounter Medications as of 06/18/2024  Medication Sig   fluticasone  (FLONASE ) 50 MCG/ACT nasal spray Place 2 sprays into both nostrils daily.   loratadine (CLARITIN) 10 MG tablet Take 10 mg by mouth as needed for allergies.   Vitamin D , Cholecalciferol, 50 MCG (2000 UT) CAPS Take by mouth.   albuterol  (VENTOLIN  HFA) 108 (90 Base) MCG/ACT inhaler Inhale 1-2 puffs into the lungs every 6 (six) hours as needed for wheezing or shortness of breath. (Patient not taking: Reported on 06/18/2024)   folic acid  (FOLVITE ) 1 MG tablet Take 1 tablet (1 mg total) by mouth daily as directed.   [DISCONTINUED] cloNIDine  (CATAPRES ) 0.1 MG tablet Take 1 tablet (0.1 mg total) by mouth daily as needed for anxiety   [DISCONTINUED] lamoTRIgine  (LAMICTAL ) 25 MG tablet Take 1 tablet (25 mg total) by mouth at bedtime.   [DISCONTINUED] propranolol  (INDERAL ) 10 MG tablet Take 0.5 tablets (5 mg total) by mouth 2 (two) times daily.   [DISCONTINUED] varenicline  (CHANTIX  CONTINUING MONTH PAK) 1 MG tablet Take 1 tablet (1 mg total) by mouth 2 (two) times daily. Starting after completing 0.5 mg rx.   No facility-administered encounter medications on file as of 06/18/2024.    Allergies as of 06/18/2024   (No Known Allergies)    Past Medical History:   Diagnosis Date   ADD (attention deficit disorder)    Anxiety    Depression    Postpartum care following cesarean delivery (8/28) 01/30/2013   Postpartum care following cesarean delivery (9/23) 02/26/2015   Vitamin D  deficiency     Past Surgical History:  Procedure Laterality Date   CESAREAN SECTION N/A 01/30/2013   Procedure: CESAREAN SECTION;  Surgeon: Robbi JONELLE Render, MD;  Location: [facility];  Service: Obstetrics;  Laterality: N/A;   CESAREAN SECTION N/A 02/26/2015   Procedure: Repeat CESAREAN SECTION;  Surgeon: Dickie Carder, MD;  Location: [facility];  Service: Obstetrics;  Laterality: N/A;   CESAREAN SECTION N/A 10/24/2017   Procedure: Repeat CESAREAN SECTION;  Surgeon: Carder Dickie, MD;  Location: [facility];  Service: Obstetrics;  Laterality: N/A;  EDD: 10/30/17   IUD REMOVAL N/A 08/28/2019   Procedure: INTRAUTERINE DEVICE (IUD) REMOVAL;  Surgeon: Render Robbi, MD;  Location: [facility];  Service: Gynecology;  Laterality: N/A;   LAPAROSCOPIC TUBAL LIGATION Bilateral 08/28/2019   Procedure: LAPAROSCOPIC TUBAL LIGATION BY SALPINGECTOMY;  Surgeon: Render Robbi, MD;  Location: [facility];  Service: Gynecology;  Laterality: Bilateral;   WISDOM TOOTH EXTRACTION  2018    Family History  Problem Relation Age of Onset   Hypertension Mother    Heart disease Mother    Hyperlipidemia Mother    Hypertension Father    Heart block  Maternal Grandmother    Stroke Neg Hx    Diabetes Neg Hx     Social History   Socioeconomic History   Marital status: Married    Spouse name: Not on file   Number of children: 3   Years of education: Not on file   Highest education level: Not on file  Occupational History   Not on file  Tobacco Use   Smoking status: Every Day    Current packs/day: 0.50    Average packs/day: 0.8 packs/day for 19.0 years (16.0 ttl pk-yrs)    Types: Cigarettes    Start date: 06/05/2005   Smokeless tobacco: Never   Tobacco  comments:    1/2 pack a day 03/19/2024 KRD  Vaping Use   Vaping status: Some Days  Substance and Sexual Activity   Alcohol  use: No   Drug use: No   Sexual activity: Yes    Birth control/protection: Surgical    Comment: Tubal ligation  Other Topics Concern   Not on file  Social History Narrative   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   Came to [place]  age 25, born in [place]   Married   2 daughters, 1 son   2 cats   Social Drivers of Health   Tobacco Use: High Risk (06/18/2024)   Patient History    Smoking Tobacco Use: Every Day    Smokeless Tobacco Use: Never    Passive Exposure: Not on file  Financial Resource Strain: Not on file  Food Insecurity: Not on file  Transportation Needs: Not on file  Physical Activity: Not on file  Stress: Not on file  Social Connections: Not on file  Intimate Partner Violence: Not on file  Depression (PHQ2-9): Low Risk (12/17/2023)   Depression (PHQ2-9)    PHQ-2 Score: 0  Alcohol  Screen: Not on file  Housing: Not on file  Utilities: Not on file  Health Literacy: Not on file    Review of Systems  Respiratory:  Positive for cough and shortness of breath.     Vitals:   06/18/24 1504  BP: 106/64  Pulse: 78  Temp: 97.7 F (36.5 C)  SpO2: 97%     Physical Exam Constitutional:      Appearance: She is obese.  HENT:     Head: Normocephalic.     Mouth/Throat:     Mouth: Mucous membranes are moist.  Eyes:     General: No scleral icterus.    Pupils: Pupils are equal, round, and reactive to light.  Cardiovascular:     Rate and Rhythm: Normal rate and regular rhythm.     Heart sounds: No murmur heard.    No friction rub.  Pulmonary:     Effort: No respiratory distress.     Breath sounds: No stridor. No wheezing, rhonchi or rales.  Musculoskeletal:     Cervical back: No rigidity or tenderness.  Lymphadenopathy:     Cervical: No cervical adenopathy.  Neurological:     Mental Status: She is alert.  Psychiatric:        Mood and Affect:  Mood normal.    Data Reviewed: Chest x-ray 12/17/2023 -No infiltrate  PFT 06/18/2024 shows no obstruction no significant bronchodilator response mild restriction with TLC of 72 normal diffusing capacity  Assessment/Plan:  Symptoms likely related to a bronchitis Symptoms are significantly improved  Some restriction on PFT  There may be some small airway disease  She will continue to work on getting off cigarettes again  Encouraged to call  with significant concerns  Follow-up only as needed   Jennet Epley MD [facility] 06/18/2024, 3:29 PM  CC: Jodie Lavern CROME, MD   "

## 2024-06-18 NOTE — Patient Instructions (Signed)
 Follow-up as needed  Call us  with significant concerns  Make sure you have albuterol  around for shortness of breath

## 2024-06-26 ENCOUNTER — Other Ambulatory Visit (HOSPITAL_BASED_OUTPATIENT_CLINIC_OR_DEPARTMENT_OTHER): Payer: Self-pay

## 2024-06-26 MED ORDER — KETOCONAZOLE 2 % EX SHAM
MEDICATED_SHAMPOO | CUTANEOUS | 11 refills | Status: AC
  Filled 2024-06-26: qty 120, 30d supply, fill #0

## 2024-06-26 MED ORDER — CLOBETASOL PROPIONATE 0.05 % EX SOLN
CUTANEOUS | 1 refills | Status: AC
  Filled 2024-06-26: qty 50, 30d supply, fill #0

## 2024-07-10 ENCOUNTER — Other Ambulatory Visit (HOSPITAL_BASED_OUTPATIENT_CLINIC_OR_DEPARTMENT_OTHER): Payer: Self-pay

## 2024-11-03 ENCOUNTER — Encounter: Admitting: Family Medicine

## 2024-11-26 ENCOUNTER — Encounter: Admitting: Family Medicine
